# Patient Record
Sex: Female | Born: 1980 | Race: Black or African American | Hispanic: No | Marital: Single | Smoking: Never smoker
Health system: Southern US, Community
[De-identification: ages and names within clinical notes are randomized; demographics above are authoritative.]

## PROBLEM LIST (undated history)

## (undated) DIAGNOSIS — D573 Sickle-cell trait: Secondary | ICD-10-CM

## (undated) DIAGNOSIS — R519 Headache, unspecified: Secondary | ICD-10-CM

## (undated) DIAGNOSIS — D649 Anemia, unspecified: Secondary | ICD-10-CM

## (undated) HISTORY — PX: TOOTH EXTRACTION: SUR596

## (undated) HISTORY — DX: Headache, unspecified: R51.9

## (undated) HISTORY — DX: Anemia, unspecified: D64.9

---

## 2011-12-31 ENCOUNTER — Other Ambulatory Visit: Payer: Self-pay

## 2011-12-31 LAB — OB RESULTS CONSOLE RUBELLA ANTIBODY, IGM: Rubella: IMMUNE

## 2011-12-31 LAB — OB RESULTS CONSOLE GC/CHLAMYDIA
Chlamydia: NEGATIVE
Gonorrhea: NEGATIVE

## 2011-12-31 LAB — OB RESULTS CONSOLE HIV ANTIBODY (ROUTINE TESTING)
HIV: NONREACTIVE
HIV: NONREACTIVE

## 2011-12-31 LAB — OB RESULTS CONSOLE ABO/RH

## 2012-01-08 ENCOUNTER — Other Ambulatory Visit (HOSPITAL_COMMUNITY): Payer: Self-pay | Admitting: Obstetrics and Gynecology

## 2012-01-08 DIAGNOSIS — O2690 Pregnancy related conditions, unspecified, unspecified trimester: Secondary | ICD-10-CM

## 2012-01-08 DIAGNOSIS — Z3689 Encounter for other specified antenatal screening: Secondary | ICD-10-CM

## 2012-01-28 ENCOUNTER — Other Ambulatory Visit (HOSPITAL_COMMUNITY): Payer: Self-pay

## 2012-01-28 ENCOUNTER — Encounter (HOSPITAL_COMMUNITY): Payer: Self-pay | Admitting: Obstetrics and Gynecology

## 2012-02-05 ENCOUNTER — Ambulatory Visit (HOSPITAL_COMMUNITY)
Admission: RE | Admit: 2012-02-05 | Discharge: 2012-02-05 | Disposition: A | Discharge status: Home / Self Care | Payer: Medicaid Other | Source: Ambulatory Visit | Attending: Obstetrics and Gynecology | Admitting: Obstetrics and Gynecology

## 2012-02-05 ENCOUNTER — Encounter (HOSPITAL_COMMUNITY): Payer: Self-pay

## 2012-02-05 VITALS — BP 105/67 | HR 100 | Wt 216.0 lb

## 2012-02-05 DIAGNOSIS — O2690 Pregnancy related conditions, unspecified, unspecified trimester: Secondary | ICD-10-CM

## 2012-02-05 DIAGNOSIS — O358XX0 Maternal care for other (suspected) fetal abnormality and damage, not applicable or unspecified: Secondary | ICD-10-CM | POA: Insufficient documentation

## 2012-02-05 DIAGNOSIS — Z3689 Encounter for other specified antenatal screening: Secondary | ICD-10-CM | POA: Insufficient documentation

## 2012-02-05 NOTE — Progress Notes (Signed)
Obstetric ultrasound performed today.   Early ultrasound reports indicate the presence of bilateral small nuchal fluid collections suggestive of a cystic hygroma.  First trimester screening for fetal aneuploidy was completed with no increased risks for fetal aneuploidy reported.  Nuchal translucency measurements for this calculation were recorded as 1.66mm.  Patient declined further testing for fetal aneuploidy today.  The option of fetal echo was described and she elected to proceed with this.   Ultrasound findings were discussed and the patient received genetic counseling.    Fetal measurements consistent with dating by early ultrasound Normal amniotic fluid volume Normal fetal anatomic survey (some limited cardiac and face views) No markers of fetal aneuploidy identified  Repeat utlrasound scheduled in 6 weeks to re evaluate fetal anatomy.  Fetal echo scheduled.   Please see full report in [facility].

## 2012-02-05 NOTE — Progress Notes (Signed)
Genetic Counseling  High-Risk Gestation Note  Appointment Date:  02/05/2012 Referred By: Sherron Monday, MD Date of Birth:  12-10-1980  Pregnancy History: G2P1001 Estimated Date of Delivery: 07/01/12 Estimated Gestational Age: [redacted]w[redacted]d  Ms. Lajean Saver and her partner were seen for genetic counseling because of the finding of a possible cystic hygroma by outside scan.  Ultrasound performed at [redacted]w[redacted]d gestation revealed a nuchal translucency (NT) of 1.9 mm.  Bilateral cystic regions in the fetal neck were noted.  We discussed that the NT size is within the normal limit for the gestational age.  However, the finding of a cystic hygroma is associated with an increased risk for fetal aneuploidy.  The NT value of 1.9 was used for first trimester screening, which revealed a negative result (1 in 10,000 for fetal Down syndrome and 1 in 6600 for fetal trisomy 18).    They were counseled that a cystic hygroma describes a septated fluid filled sac at the back of the neck that typically results from failure of the fetal lymphatic system.  We discussed the various etiologies for a hygroma including normal variation (immature lymphatic system), a chromosome condition, single gene condition, or a congenital anomaly (heart defect). We discussed that if a true cystic hygroma was observed, the risk for aneuploidy could be as high as 50%.  Given the normal protein values found by maternal serum first trimester screening, and the small size of the reported NT, the risk is most likely much less. We reviewed chromosomes, nondisjunction, and the common features and variable prognoses of fetal aneuploidy including Down syndrome, trisomies 40 and 56, and Turner syndrome.    We reviewed available screening and diagnostic options.  Regarding screening tests, we discussed the options of ultrasound and noninvasive prenatal diagnosis (NIPT), also known as cell free fetal DNA testing.  In addition, we discussed the availability of  amniocentesis.  The risks, benefits, and limitations of each of these options were reviewed in detail.  After thoughtful consideration of these options, Ms. Collister elected to proceed with a detailed ultrasound.  The ultrasound report will be sent under separate cover.  Ultrasound today did not reveal any anomalies or soft markers for fetal aneuploidy.  There was no evidence of a cystic hygroma or nuchal thickening.  Ms. Josephson declined the option of NIPT and amniocentesis.  Because of the earlier suspicion of a cystic hygroma, the option of fetal echocardiogram was discussed.  She expressed interest in this testing.  We will contact her with an appointment for a fetal echocardiogram.  Ms. Vent was provided with written information regarding sickle cell anemia (SCA) including the carrier frequency and incidence in the African-American population, the availability of carrier testing and prenatal diagnosis if indicated.  In addition, we discussed that hemoglobinopathies are routinely screened for as part of the [place] newborn screening panel.  She declined hemoglobin electrophoresis today.  I counseled this couple regarding the above risks and available options.  The approximate face-to-face time with the genetic counselor was 28 minutes.  Despina Arias, MS Certified Genetic Counselor

## 2012-03-18 ENCOUNTER — Ambulatory Visit (HOSPITAL_COMMUNITY)
Admission: RE | Admit: 2012-03-18 | Discharge: 2012-03-18 | Disposition: A | Discharge status: Home / Self Care | Payer: Medicaid Other | Source: Ambulatory Visit | Attending: Obstetrics and Gynecology | Admitting: Obstetrics and Gynecology

## 2012-03-18 DIAGNOSIS — Z3689 Encounter for other specified antenatal screening: Secondary | ICD-10-CM

## 2012-03-18 DIAGNOSIS — Z1389 Encounter for screening for other disorder: Secondary | ICD-10-CM | POA: Insufficient documentation

## 2012-03-18 DIAGNOSIS — E669 Obesity, unspecified: Secondary | ICD-10-CM | POA: Insufficient documentation

## 2012-03-18 DIAGNOSIS — O358XX0 Maternal care for other (suspected) fetal abnormality and damage, not applicable or unspecified: Secondary | ICD-10-CM | POA: Insufficient documentation

## 2012-03-18 DIAGNOSIS — O2690 Pregnancy related conditions, unspecified, unspecified trimester: Secondary | ICD-10-CM

## 2012-03-18 DIAGNOSIS — Z363 Encounter for antenatal screening for malformations: Secondary | ICD-10-CM | POA: Insufficient documentation

## 2012-03-18 NOTE — Progress Notes (Signed)
Patient seen today  for follow up ultrasound.  See full report in [facility].  Alpha Gula, MD  Early ultrasound reports indicate the presence of bilateral small nuchal fluid collections suggestive of a cystic hygroma.  First trimester screening for fetal aneuploidy was completed with no increased risks for fetal aneuploidy reported.  Nuchal translucency measurements for this calculation were recorded as 1.66mm.  Patient had a normal fetal echo.  Single IUP at 25 0/7 weeks Interval fetal growth is appropriate Normal fetal anatomic survey s/p normal fetal echo Normal amniotic fluid volume  Recommend follow up as clinically indicated

## 2012-06-23 ENCOUNTER — Inpatient Hospital Stay (HOSPITAL_COMMUNITY)
Admission: AD | Admit: 2012-06-23 | Discharge: 2012-06-23 | Disposition: A | Discharge status: Home / Self Care | Payer: Medicaid Other | Source: Ambulatory Visit | Attending: Obstetrics and Gynecology | Admitting: Obstetrics and Gynecology

## 2012-06-23 ENCOUNTER — Encounter (HOSPITAL_COMMUNITY): Payer: Self-pay | Admitting: *Deleted

## 2012-06-23 DIAGNOSIS — O479 False labor, unspecified: Secondary | ICD-10-CM | POA: Insufficient documentation

## 2012-06-23 HISTORY — DX: Sickle-cell trait: D57.3

## 2012-06-23 MED ORDER — BUTORPHANOL TARTRATE 1 MG/ML IJ SOLN
1.0000 mg | Freq: Once | INTRAMUSCULAR | Status: AC
  Administered 2012-06-23: 1 mg via INTRAMUSCULAR
  Filled 2012-06-23: qty 1

## 2012-06-23 NOTE — MAU Note (Signed)
Patient states she is having contractions every 3-5 minutes with no leaking or bleeding. Reports good fetal movement.

## 2012-06-23 NOTE — MAU Note (Signed)
uc's since yesterday, more regular during the night.  Denies bleeding or LOF.

## 2012-06-24 ENCOUNTER — Encounter (HOSPITAL_COMMUNITY): Payer: Self-pay | Admitting: Anesthesiology

## 2012-06-24 ENCOUNTER — Inpatient Hospital Stay (HOSPITAL_COMMUNITY): Payer: Medicaid Other | Admitting: Anesthesiology

## 2012-06-24 ENCOUNTER — Encounter (HOSPITAL_COMMUNITY): Payer: Self-pay | Admitting: *Deleted

## 2012-06-24 ENCOUNTER — Inpatient Hospital Stay (HOSPITAL_COMMUNITY)
Admission: AD | Admit: 2012-06-24 | Discharge: 2012-06-26 | DRG: 775 | Disposition: A | Discharge status: Home / Self Care | Payer: Medicaid Other | Source: Ambulatory Visit | Attending: Obstetrics and Gynecology | Admitting: Obstetrics and Gynecology

## 2012-06-24 LAB — CBC
HCT: 29.2 % — ABNORMAL LOW (ref 36.0–46.0)
Hemoglobin: 10 g/dL — ABNORMAL LOW (ref 12.0–15.0)
MCV: 67.3 fL — ABNORMAL LOW (ref 78.0–100.0)
RBC: 4.34 MIL/uL (ref 3.87–5.11)
WBC: 10.8 10*3/uL — ABNORMAL HIGH (ref 4.0–10.5)

## 2012-06-24 LAB — ABO/RH: ABO/RH(D): O POS

## 2012-06-24 MED ORDER — DIBUCAINE 1 % RE OINT: 1.0000 "application " | TOPICAL_OINTMENT | RECTAL | Status: DC | PRN

## 2012-06-24 MED ORDER — EPHEDRINE 5 MG/ML INJ
10.0000 mg | INTRAVENOUS | Status: DC | PRN
  Filled 2012-06-24: qty 4

## 2012-06-24 MED ORDER — BENZOCAINE-MENTHOL 20-0.5 % EX AERO
1.0000 "application " | INHALATION_SPRAY | CUTANEOUS | Status: DC | PRN
  Administered 2012-06-24: 1 via TOPICAL
  Filled 2012-06-24: qty 56

## 2012-06-24 MED ORDER — FLEET ENEMA 7-19 GM/118ML RE ENEM: 1.0000 | ENEMA | RECTAL | Status: DC | PRN

## 2012-06-24 MED ORDER — DIPHENHYDRAMINE HCL 25 MG PO CAPS: 25.0000 mg | ORAL_CAPSULE | Freq: Four times a day (QID) | ORAL | Status: DC | PRN

## 2012-06-24 MED ORDER — OXYTOCIN 40 UNITS IN LACTATED RINGERS INFUSION - SIMPLE MED
1.0000 m[IU]/min | INTRAVENOUS | Status: DC
  Administered 2012-06-24: 2 m[IU]/min via INTRAVENOUS

## 2012-06-24 MED ORDER — FENTANYL 2.5 MCG/ML BUPIVACAINE 1/10 % EPIDURAL INFUSION (WH - ANES)
14.0000 mL/h | INTRAMUSCULAR | Status: DC
  Administered 2012-06-24: 14 mL/h via EPIDURAL
  Filled 2012-06-24 (×2): qty 60

## 2012-06-24 MED ORDER — PRENATAL MULTIVITAMIN CH
1.0000 | ORAL_TABLET | Freq: Every day | ORAL | Status: DC
  Administered 2012-06-24 – 2012-06-26 (×3): 1 via ORAL
  Filled 2012-06-24 (×3): qty 1

## 2012-06-24 MED ORDER — LACTATED RINGERS IV SOLN: 500.0000 mL | Freq: Once | INTRAVENOUS | Status: DC

## 2012-06-24 MED ORDER — LACTATED RINGERS IV SOLN: 500.0000 mL | INTRAVENOUS | Status: DC | PRN

## 2012-06-24 MED ORDER — FENTANYL 2.5 MCG/ML BUPIVACAINE 1/10 % EPIDURAL INFUSION (WH - ANES)
INTRAMUSCULAR | Status: DC | PRN
  Administered 2012-06-24: 14 mL/h via EPIDURAL

## 2012-06-24 MED ORDER — ONDANSETRON HCL 4 MG/2ML IJ SOLN: 4.0000 mg | Freq: Four times a day (QID) | INTRAMUSCULAR | Status: DC | PRN

## 2012-06-24 MED ORDER — TERBUTALINE SULFATE 1 MG/ML IJ SOLN: 0.2500 mg | Freq: Once | INTRAMUSCULAR | Status: DC | PRN

## 2012-06-24 MED ORDER — TETANUS-DIPHTH-ACELL PERTUSSIS 5-2.5-18.5 LF-MCG/0.5 IM SUSP: 0.5000 mL | Freq: Once | INTRAMUSCULAR | Status: DC

## 2012-06-24 MED ORDER — LIDOCAINE HCL (PF) 1 % IJ SOLN
30.0000 mL | INTRAMUSCULAR | Status: DC | PRN
  Filled 2012-06-24: qty 30

## 2012-06-24 MED ORDER — SENNOSIDES-DOCUSATE SODIUM 8.6-50 MG PO TABS
2.0000 | ORAL_TABLET | Freq: Every day | ORAL | Status: DC
  Administered 2012-06-24 – 2012-06-25 (×2): 2 via ORAL

## 2012-06-24 MED ORDER — DIPHENHYDRAMINE HCL 50 MG/ML IJ SOLN: 12.5000 mg | INTRAMUSCULAR | Status: DC | PRN

## 2012-06-24 MED ORDER — OXYTOCIN 40 UNITS IN LACTATED RINGERS INFUSION - SIMPLE MED
62.5000 mL/h | Freq: Once | INTRAVENOUS | Status: DC
  Filled 2012-06-24: qty 1000

## 2012-06-24 MED ORDER — IBUPROFEN 600 MG PO TABS: 600.0000 mg | ORAL_TABLET | Freq: Four times a day (QID) | ORAL | Status: DC | PRN

## 2012-06-24 MED ORDER — OXYTOCIN 40 UNITS IN LACTATED RINGERS INFUSION - SIMPLE MED: 62.5000 mL/h | INTRAVENOUS | Status: AC | PRN

## 2012-06-24 MED ORDER — SIMETHICONE 80 MG PO CHEW: 80.0000 mg | CHEWABLE_TABLET | ORAL | Status: DC | PRN

## 2012-06-24 MED ORDER — LIDOCAINE HCL (PF) 1 % IJ SOLN
INTRAMUSCULAR | Status: DC | PRN
  Administered 2012-06-24: 30 mL
  Administered 2012-06-24 (×2): 4 mL

## 2012-06-24 MED ORDER — ONDANSETRON HCL 4 MG/2ML IJ SOLN: 4.0000 mg | INTRAMUSCULAR | Status: DC | PRN

## 2012-06-24 MED ORDER — CITRIC ACID-SODIUM CITRATE 334-500 MG/5ML PO SOLN: 30.0000 mL | ORAL | Status: DC | PRN

## 2012-06-24 MED ORDER — IBUPROFEN 600 MG PO TABS
600.0000 mg | ORAL_TABLET | Freq: Four times a day (QID) | ORAL | Status: DC
  Administered 2012-06-24 – 2012-06-26 (×8): 600 mg via ORAL
  Filled 2012-06-24 (×10): qty 1

## 2012-06-24 MED ORDER — PHENYLEPHRINE 40 MCG/ML (10ML) SYRINGE FOR IV PUSH (FOR BLOOD PRESSURE SUPPORT): 80.0000 ug | PREFILLED_SYRINGE | INTRAVENOUS | Status: DC | PRN

## 2012-06-24 MED ORDER — LACTATED RINGERS IV SOLN: INTRAVENOUS | Status: AC

## 2012-06-24 MED ORDER — OXYCODONE-ACETAMINOPHEN 5-325 MG PO TABS
1.0000 | ORAL_TABLET | ORAL | Status: DC | PRN
  Filled 2012-06-24: qty 1

## 2012-06-24 MED ORDER — ACETAMINOPHEN 325 MG PO TABS: 650.0000 mg | ORAL_TABLET | ORAL | Status: DC | PRN

## 2012-06-24 MED ORDER — MEASLES, MUMPS & RUBELLA VAC ~~LOC~~ INJ
0.5000 mL | INJECTION | Freq: Once | SUBCUTANEOUS | Status: DC
  Filled 2012-06-24: qty 0.5

## 2012-06-24 MED ORDER — OXYCODONE-ACETAMINOPHEN 5-325 MG PO TABS: 1.0000 | ORAL_TABLET | ORAL | Status: DC | PRN

## 2012-06-24 MED ORDER — BUTORPHANOL TARTRATE 1 MG/ML IJ SOLN
1.0000 mg | Freq: Once | INTRAMUSCULAR | Status: AC
  Administered 2012-06-24: 1 mg via INTRAVENOUS
  Filled 2012-06-24: qty 1

## 2012-06-24 MED ORDER — PHENYLEPHRINE 40 MCG/ML (10ML) SYRINGE FOR IV PUSH (FOR BLOOD PRESSURE SUPPORT)
80.0000 ug | PREFILLED_SYRINGE | INTRAVENOUS | Status: DC | PRN
  Filled 2012-06-24: qty 5

## 2012-06-24 MED ORDER — WITCH HAZEL-GLYCERIN EX PADS: 1.0000 "application " | MEDICATED_PAD | CUTANEOUS | Status: DC | PRN

## 2012-06-24 MED ORDER — ONDANSETRON HCL 4 MG PO TABS: 4.0000 mg | ORAL_TABLET | ORAL | Status: DC | PRN

## 2012-06-24 MED ORDER — OXYTOCIN BOLUS FROM INFUSION
250.0000 mL | Freq: Once | INTRAVENOUS | Status: AC
  Administered 2012-06-24: 250 mL via INTRAVENOUS
  Filled 2012-06-24: qty 500

## 2012-06-24 MED ORDER — EPHEDRINE 5 MG/ML INJ: 10.0000 mg | INTRAVENOUS | Status: DC | PRN

## 2012-06-24 MED ORDER — ZOLPIDEM TARTRATE 5 MG PO TABS: 5.0000 mg | ORAL_TABLET | Freq: Every evening | ORAL | Status: DC | PRN

## 2012-06-24 MED ORDER — LANOLIN HYDROUS EX OINT: TOPICAL_OINTMENT | CUTANEOUS | Status: DC | PRN

## 2012-06-24 MED ORDER — LACTATED RINGERS IV SOLN
INTRAVENOUS | Status: DC
  Administered 2012-06-24 (×3): via INTRAVENOUS

## 2012-06-24 NOTE — Progress Notes (Signed)
Patient ID: Stacy Weaver, female   DOB: 28-Jul-1981, 28 y.o.   MRN: [id number] Exam at 8:50 AM cervix 7-8 cm 100% effaced and the vertex was at 0 station

## 2012-06-24 NOTE — Anesthesia Postprocedure Evaluation (Signed)
  Anesthesia Post-op Note  Patient: Stacy Weaver  Procedure(s) Performed: * No procedures listed *  Patient Location: Mother/Baby  Anesthesia Type: Epidural  Level of Consciousness: awake, alert  and oriented  Airway and Oxygen Therapy: Patient Spontanous Breathing  Post-op Pain: mild  Post-op Assessment: Post-op Vital signs reviewed, Patient's Cardiovascular Status Stable, Pain level controlled, No headache, No backache, No residual numbness and No residual motor weakness  Post-op Vital Signs: Reviewed and stable  Complications: No apparent anesthesia complications

## 2012-06-24 NOTE — Anesthesia Preprocedure Evaluation (Signed)

## 2012-06-24 NOTE — Progress Notes (Signed)
Patient ID: Stacy Weaver, female   DOB: 24-Sep-1981, 31 y.o.   MRN: [id number] Delivery note:  The pt became fully dilated and with 3 or 4 contractions delivered a living female infant spontaneously LOA over a first degree laceration. Apgars were 9 and 9 at 1 and 5 minutes. There was no explanation for the FHR decelerations. The placenta was intact and the uterus was normal The laceration was repaired with 3-0 vicryl under local block at the pt's request. EBL 300 cc's.

## 2012-06-24 NOTE — Progress Notes (Signed)
Patient ID: Stacy Weaver, female   DOB: 08-11-1981, 32 y.o.   MRN: [id number] At 7:55 AM cervix was 4-5 cm 70% effaced and the vertex was at - 2 station. AROM produced thin, lightly meconium stained fluid and the FHR decelerated into the 70's or 80's. The pt was positioned and the FHR recovered with decelerations only with contractions. O2 was begun Will watch closely/

## 2012-06-24 NOTE — MAU Note (Signed)
Contractions, denies bleeding or ROM 

## 2012-06-24 NOTE — Anesthesia Procedure Notes (Signed)
Epidural Patient location during procedure: OB Start time: 06/24/2012 4:55 AM  Staffing Anesthesiologist: Myrl Bynum A. Performed by: anesthesiologist   Preanesthetic Checklist Completed: patient identified, site marked, surgical consent, pre-op evaluation, timeout performed, IV checked, risks and benefits discussed and monitors and equipment checked  Epidural Patient position: sitting Prep: site prepped and draped and DuraPrep Patient monitoring: continuous pulse ox and blood pressure Approach: midline Injection technique: LOR air  Needle:  Needle type: Tuohy  Needle gauge: 17 G Needle length: 9 cm Needle insertion depth: 8 cm Catheter type: closed end flexible Catheter size: 19 Gauge Catheter at skin depth: 13 cm Test dose: negative and Other  Assessment Events: blood not aspirated, injection not painful, no injection resistance, negative IV test and no paresthesia  Additional Notes Patient identified. Risks and benefits discussed including failed block, incomplete  Pain control, post dural puncture headache, nerve damage, paralysis, blood pressure Changes, nausea, vomiting, reactions to medications-both toxic and allergic and post Partum back pain. All questions were answered. Patient expressed understanding and wished to proceed. Sterile technique was used throughout procedure. Epidural site was Dressed with sterile barrier dressing. No paresthesias, signs of intravascular injection Or signs of intrathecal spread were encountered.  Patient was more comfortable after the epidural was dosed. Please see RN's note for documentation of vital signs and FHR which are stable.

## 2012-06-24 NOTE — H&P (Signed)
Stacy Weaver is a 31 y.o. female G2 P1001 at 39 weeks (EDD 07/01/12 by 13 week [place]) presenting for persistent contractions for over 24 hours. Was seen in MAU 8/26 with contractions and observed for 3 hours with minimal cervical change.  Pt d/c home and returned early this AM at about 2am.  Cervix was still about the same, but as patient was very uncomfortable was given pain meds and observed for 2 hours and cervix eventually did change to 4 cm so was admitted.  Prenatal care was comp[licated by a finding of possible bilateral small cystic hygromas at 13 weeks.  On consult with MFM they were resolved and first trimester screen WNL.  THe patient declined amnio, but consented to an anatomy US and fetal [facility] which were both WNL.  Her prenatal care started a bit late at 13-14 weeks, but was consistent after that.  Maternal Medical History:  Reason for admission: Reason for admission: contractions.  Contractions: Onset was 13-24 hours ago.   Frequency: regular.   Perceived severity is moderate.    Fetal activity: Perceived fetal activity is normal.      OB History    Grav Para Term Preterm Abortions TAB SAB Ect Mult Living   2 1 1  0 0 0 0 0 0 1    NSVD 2011 7#4oz  Past Medical History  Diagnosis Date  . Sickle cell trait   (Hgb AC)  Past Surgical History  Procedure Date  . Tooth extraction    Family History: family history is not on file. Social History:  reports that she has never smoked. She does not have any smokeless tobacco history on file. She reports that she does not drink alcohol or use illicit drugs.   Prenatal Transfer Tool  Maternal Diabetes: No Genetic Screening: Normal First trimester screen and AFP Maternal Ultrasounds/Referrals: Abnormal:  Findings:   Other: possible early bilateral cystic hygromas that resolved Fetal Ultrasounds or other Referrals:  Fetal echo, Referred to Materal Fetal Medicine  further w/u WNL Maternal Substance Abuse:  No Significant Maternal  Medications:  None Significant Maternal Lab Results:  Lab values include: Other:   Hgb AC carrier Other Comments:  None  ROS  Dilation: 5 Effacement (%): 60 Station: -3 Exam by:: a. white rn Blood pressure 107/70, pulse 78, temperature 97.7 F (36.5 C), temperature source Axillary, resp. rate 18, height 5\' 6"  (1.676 m), weight 97.977 kg (216 lb), last menstrual period 10/04/2011, SpO2 100.00%. Maternal Exam:  Uterine Assessment: Contraction strength is moderate.  Contraction frequency is regular.   Abdomen: Patient reports no abdominal tenderness. Fetal presentation: vertex  Introitus: Normal vulva. Normal vagina.    Physical Exam  Constitutional: She is oriented to person, place, and time. She appears well-developed and well-nourished.  Cardiovascular: Normal rate and regular rhythm.   Respiratory: Effort normal and breath sounds normal.  GI: Soft. Bowel sounds are normal.  Genitourinary: Vagina normal and uterus normal.  Neurological: She is alert and oriented to person, place, and time.  Psychiatric: She has a normal mood and affect. Her behavior is normal.    Prenatal labs: ABO, Rh: O/Positive/-- (03/05 0000) Antibody: Negative (03/05 0000) Rubella: Immune (03/05 0000) RPR: Nonreactive (03/05 0000)  HBsAg: Negative (03/05 0000)  HIV: Non-reactive, Non-reactive (03/05 0000)  GBS: Negative (08/07 0000)  One hour GCT 105 Hgb AC First trimester screen WNL  Assessment/Plan: Pt admitted with cervical change.  Received epidural and comfortable.  Augmented with pitocin.  Will attempt AROM when station a  little lower.   Oliver Pila 06/24/2012, 6:29 AM

## 2012-06-25 LAB — CBC
MCH: 23.2 pg — ABNORMAL LOW (ref 26.0–34.0)
MCHC: 34.4 g/dL (ref 30.0–36.0)
MCV: 67.6 fL — ABNORMAL LOW (ref 78.0–100.0)
Platelets: 276 10*3/uL (ref 150–400)
RBC: 3.7 MIL/uL — ABNORMAL LOW (ref 3.87–5.11)

## 2012-06-25 NOTE — Progress Notes (Signed)
Patient ID: Stacy Weaver, female   DOB: Feb 26, 1981, 31 y.o.   MRN: [id number] #1 afebrile BP normal HGB 10.0 to 8.5 Some cramps

## 2012-06-25 NOTE — Progress Notes (Signed)
UR chart review completed.  

## 2012-06-26 ENCOUNTER — Ambulatory Visit (HOSPITAL_COMMUNITY): Payer: Medicaid Other

## 2012-06-26 MED ORDER — IBUPROFEN 600 MG PO TABS: 600.0000 mg | ORAL_TABLET | Freq: Four times a day (QID) | ORAL | Status: AC | PRN

## 2012-06-26 MED ORDER — FERROUS SULFATE 325 (65 FE) MG PO TABS: 325.0000 mg | ORAL_TABLET | Freq: Every day | ORAL | Status: DC

## 2012-06-26 NOTE — Progress Notes (Signed)
Patient ID: Stacy Weaver, female   DOB: 08-Dec-1980, 31 y.o.   MRN: [id number] #[id number] afebrile BP normal no complaints ready for d/c

## 2012-06-27 NOTE — Discharge Summary (Signed)
Stacy Weaver, Stacy Weaver NO.:  [id number]  MEDICAL RECORD NO.:  [id number]  LOCATION:  9110                          FACILITY:  [facility]  PHYSICIAN:  Malachi Pro. Ambrose Mantle, M.D. DATE OF BIRTH:  10-23-1981  DATE OF ADMISSION:  06/24/2012 DATE OF DISCHARGE:  06/26/2012                              DISCHARGE SUMMARY   HISTORY OF PRESENT ILLNESS:  This is a 31 year old black female, para 1- 0-0-1, gravida 2, EDC July 01, 2012, by 13-week ultrasound, presented for persistent contractions.  She was seen in the Maternity Admission Unit on June 22, 2012, with contractions and observed for 3 hours with minimal cervical change, discharged home, returned early in the morning on June 24, 2012.  Cervix still about the same.  She was very uncomfortable.  She was observed for 2 hours.  The cervix eventually did change to 4 cm, so she was admitted.  Prenatal care was complicated by a finding of possible bilateral small cystic hygromas at 13 weeks.  When she was seen in the MFM, they had resolved.  First trimester screen was normal.  The patient declined amnio.  Fetal echo was normal.  Anatomy ultrasound was normal.  Blood group and type O positive, negative antibody, rubella immune, RPR nonreactive, hepatitis B surface antigen negative, HIV negative, GBS negative.  One-hour Glucola 105, hemoglobin AC, first trimester screen was normal.  The patient has a negative medical history.  She has had tooth extracted, but otherwise no surgical history.  She never smoked.  She did not drink alcohol or take drugs.  At 7:55 a.m., the cervix was 4-5 cm, 70%, vertex at a -2.  Artificial rupture of the membranes produced thin, slightly meconium-stained fluid.  Fetal heart rate decelerated into the 70s or 80s.  The patient was positioned.  The fetal heart rate recovered with decelerations only with contractions.  Oxygen was begun.  Exam at 8:50 a.m., the cervix was 7-8 cm, 100% vertex at 0 station.   The patient became fully dilated, and with 3 or 4 contractions, delivered a living female infant, spontaneously LOA over first-degree laceration.  Apgars were 9 and 9 at 1 and 5 minutes.  There was no explanation for the fetal heart rate decelerations.  The placenta was intact.  Uterus normal. Laceration repaired with 3-0 Vicryl under local block at the patient's request.  Blood loss about 300 mL.  Postpartum, the patient did well and was discharged on the second postpartum day.  Initial hemoglobin 10.0, hematocrit 29.2, white count 10,800, platelet count 342,000, RPR nonreactive.  Followup hemoglobin 8.6, hematocrit 25.0.  FINAL DIAGNOSES:  Intrauterine pregnancy at 39 weeks, delivered, vertex.  OPERATIONS: 1. Spontaneous delivery, vertex. 2. Repair of first-degree perineal laceration.  FINAL CONDITION:  Improved.  DISCHARGE INSTRUCTIONS:  Include our regular discharge instruction booklet.  The patient was advised to return in 6 weeks.  She was given a discharge instruction booklet and the after visit summary.  MEDICATIONS:  Motrin 600 mg, 30 tablets, 1 every 6 hours as needed for pain was given at discharge.     Malachi Pro. Ambrose Mantle, M.D.     TFH/MEDQ  D:  06/26/2012  T:  06/27/2012  Job:  [id number]

## 2012-07-01 ENCOUNTER — Ambulatory Visit (HOSPITAL_COMMUNITY): Payer: Medicaid Other

## 2012-08-25 ENCOUNTER — Encounter (HOSPITAL_COMMUNITY): Payer: Self-pay | Admitting: Emergency Medicine

## 2012-08-25 ENCOUNTER — Emergency Department (HOSPITAL_COMMUNITY)
Admission: EM | Admit: 2012-08-25 | Discharge: 2012-08-25 | Disposition: A | Discharge status: Home / Self Care | Payer: Medicaid Other | Source: Home / Self Care | Attending: Emergency Medicine | Admitting: Emergency Medicine

## 2012-08-25 DIAGNOSIS — G609 Hereditary and idiopathic neuropathy, unspecified: Secondary | ICD-10-CM

## 2012-08-25 DIAGNOSIS — G629 Polyneuropathy, unspecified: Secondary | ICD-10-CM

## 2012-08-25 MED ORDER — PREDNISONE 10 MG PO TABS: ORAL_TABLET | ORAL | Status: DC

## 2012-08-25 NOTE — ED Notes (Signed)
Reports migraine on Sunday: starts out as flashing lights, then goes to one side of head .  This time, pain was on right side of head.    Migraine left yesterday.  Then started having numbness and tingling in left arm and hand.  Patient thinks numbness and tingling has been intermittent, but when it occurs it is getting worse.  Reports no change in strength per patient.  Patient is left handed.  Denies neck pain.  Patient works with computers-billing at [employer]

## 2012-08-25 NOTE — ED Notes (Addendum)
Patient took benadryl because she broke out with hives.   Patient reports she had ibuprofen, but has never reacted to ibuprofen before.

## 2012-08-25 NOTE — ED Provider Notes (Signed)
Chief Complaint  Patient presents with  . Numbness    History of Present Illness:   The patient is a 31 year old female who presents with a two-day history of numbness and tingling of the left arm and hand. This came on after she was holding her infant son in her arm for several hours at a time. After she got up, she felt that the arm had fallen asleep and it hasn't completely gotten back to normal yet. The arm feels somewhat achy and stiff. She denies any weakness of the arm. The patient had a migraine headache this past Sunday, 3 days ago. It was located on the right side with visual aura, photophobia, phonophobia, and nausea. She took ibuprofen and slept for a few hours and the headache went away. It has not come back since that. After taking ibuprofen she broke out in hives. She took a Benadryl and is now better. The hives are mostly on her face. She has gotten hives before after eating seafood but never after ibuprofen. She denies any difficulty breathing or wheezing, swelling of lips, tongue, or throat. She's had no diplopia or blurry vision but does have pain behind her left eye. She denies any difficulty speaking or swallowing. There is no neck pain and she has full range of motion of her neck and shoulder. She denies any numbness in the face. There is no numbness, tingling, or weakness in the lower extremities.  Review of Systems:  Other than noted above, the patient denies any of the following symptoms: Systemic:  No fever, chills, fatigue, photophobia, stiff neck. Eye:  No redness, eye pain, discharge, blurred vision, or diplopia. ENT:  No nasal congestion, rhinorrhea, sinus pressure or pain, sneezing, earache, or sore throat.  No jaw claudication. Neuro:  No paresthesias, loss of consciousness, seizure activity, muscle weakness, trouble with coordination or gait, trouble speaking or swallowing. Psych:  No depression, anxiety or trouble sleeping.  PMFSH:  Past medical history, family  history, social history, meds, and allergies were reviewed.  Physical Exam:   Vital signs:  BP 117/77  Pulse 89  Temp 97.6 F (36.4 C) (Oral)  Resp 16  SpO2 97%  Breastfeeding? Unknown General:  Alert and oriented.  In no distress. Eye:  Lids and conjunctivas normal.  PERRL,  Full EOMs.  Fundi benign with normal discs and vessels. ENT:  No cranial or facial tenderness to palpation.  TMs and canals clear.  Nasal mucosa was normal and uncongested without any drainage. No intra oral lesions, pharynx clear, mucous membranes moist, dentition normal. Neck:  Supple, full ROM, no tenderness to palpation.  No adenopathy or mass. No carotid bruit. Lungs: Clear to auscultation. Heart: Regular rhythm, no gallop or murmur. Extremities: No swelling, pulses full, joint survey is unremarkable, muscle strength and sensation were normal, Tinel and Phalen signs were negative. Neuro:  Alert and orented times 3.  Speech was clear, fluent, and appropriate.  Cranial nerves intact. No pronator drift, muscle strength normal. Finger to nose normal.  DTRs were 2+ and symmetrical.Station and gait were normal.  Romberg's sign was normal.  Able to perform tandem gait well. Psych:  Normal affect.  Course in Urgent Care Center:   She was given a wrist splint.  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 It may be in the cubital tunnel area, carpal tunnel area, or even in the neck or shoulder. If it  persists despite conservative management, she will need to see a neurologist.  Plan:   1.  The following meds were prescribed:   New Prescriptions   PREDNISONE (DELTASONE) 10 MG TABLET    2 tabs daily for 10 days, then 1 tab daily for 10 days.   2.  The patient was instructed in symptomatic care and handouts were given. She was given some exercises to do for the arm. 3.  The patient was told to return if becoming  worse in any way, if no better in 3 or 4 days, and given some red flag symptoms that would indicate earlier return.    Reuben Likes, MD 08/25/12 [id number]

## 2013-08-26 ENCOUNTER — Encounter (HOSPITAL_COMMUNITY): Payer: Self-pay | Admitting: Emergency Medicine

## 2013-08-26 ENCOUNTER — Emergency Department (INDEPENDENT_AMBULATORY_CARE_PROVIDER_SITE_OTHER)
Admission: EM | Admit: 2013-08-26 | Discharge: 2013-08-26 | Disposition: A | Discharge status: Home / Self Care | Payer: Self-pay | Source: Home / Self Care | Attending: Family Medicine | Admitting: Family Medicine

## 2013-08-26 DIAGNOSIS — H9209 Otalgia, unspecified ear: Secondary | ICD-10-CM

## 2013-08-26 DIAGNOSIS — J069 Acute upper respiratory infection, unspecified: Secondary | ICD-10-CM

## 2013-08-26 DIAGNOSIS — H9203 Otalgia, bilateral: Secondary | ICD-10-CM

## 2013-08-26 MED ORDER — AZITHROMYCIN 250 MG PO TABS: 250.0000 mg | ORAL_TABLET | Freq: Every day | ORAL | Status: DC

## 2013-08-26 MED ORDER — IPRATROPIUM BROMIDE 0.03 % NA SOLN: 2.0000 | Freq: Two times a day (BID) | NASAL | Status: DC

## 2013-08-26 NOTE — ED Notes (Signed)
Pt c/o poss sinus infection States she just got over a cold about 1 week ago but still having some sxs Sxs include: facial pressure, ears clogged, nauseas, congestion Denies: f/v/d, SOB, wheezing Taking sudafed w/temp relief.  Alert w/no signs of acute distress.

## 2013-08-26 NOTE — ED Provider Notes (Signed)
Stacy Weaver is a 32 y.o. female who presents to Urgent Care today for facial pressure nasal congestion and bilateral ear pain with mild nausea and mild dizziness. No fever sore throat or cough. Some sneezing. Patient has tried oral Sudafed which helped a bit. Symptoms have been present for about one week. Symptoms started after she got over a cold. She denies any chest pains palpitations vomiting or diarrhea. She feels well otherwise.   Past Medical History  Diagnosis Date  . Sickle cell trait    History  Substance Use Topics  . Smoking status: Never Smoker   . Smokeless tobacco: Not on file  . Alcohol Use: No   ROS as above Medications reviewed. No current facility-administered medications for this encounter.   Current Outpatient Prescriptions  Medication Sig Dispense Refill  . acetaminophen (TYLENOL) 325 MG tablet Take 650 mg by mouth every 6 (six) hours as needed.      Marland Kitchen azithromycin (ZITHROMAX) 250 MG tablet Take 1 tablet (250 mg total) by mouth daily. Take first 2 tablets together, then 1 every day until finished.  6 tablet  0  . diphenhydrAMINE (BENADRYL) 25 mg capsule Take 25 mg by mouth every 6 (six) hours as needed.      . ferrous sulfate 325 (65 FE) MG tablet Take 1 tablet (325 mg total) by mouth daily with breakfast.  30 tablet  11  . ibuprofen (ADVIL,MOTRIN) 200 MG tablet Take 200 mg by mouth every 6 (six) hours as needed.      Marland Kitchen ipratropium (ATROVENT) 0.03 % nasal spray Place 2 sprays into the nose every 12 (twelve) hours.  30 mL  1  . Prenatal Vit-Fe Fumarate-FA (PRENATAL MULTIVITAMIN) TABS Take 1 tablet by mouth every morning.        Exam:  BP 107/70  Pulse 85  Temp(Src) 98.3 F (36.8 C) (Oral)  Resp 16  SpO2 100%  LMP 08/23/2013 Gen: Well NAD HEENT: EOMI,  MMM, tympanic membranes bilaterally have mild effusion with retraction and no erythema. Posterior pharynx with some cobblestoning. Nontender maxillary sinuses bilaterally. Lungs: CTABL Nl WOB Heart: RRR no  MRG Abd: NABS, NT, ND Exts: Non edematous BL  LE, warm and well perfused.    Assessment and Plan: 32 y.o. female with nasal congestion with ear pain and mild effusion. Some evidence of eustachian tube dysfunction.  Plan to treat with Atrovent nasal spray, and oral decongestants. Additionally we'll use azithromycin if not improving in several days. Work note provided.  Discussed warning signs or symptoms. Please see discharge instructions. Patient expresses understanding.      Rodolph Bong, MD 08/26/13 1224

## 2013-09-08 ENCOUNTER — Emergency Department: Payer: Self-pay | Admitting: Emergency Medicine

## 2013-09-08 LAB — URINALYSIS, COMPLETE
Bacteria: NONE SEEN
Bilirubin,UR: NEGATIVE
Ketone: NEGATIVE
RBC,UR: <1 /HPF (ref 0–5)
Specific Gravity: 1.01 (ref 1.003–1.030)
Squamous Epithelial: 23
WBC UR: 16 /HPF (ref 0–5)

## 2013-09-11 ENCOUNTER — Emergency Department: Payer: Self-pay | Admitting: Emergency Medicine

## 2014-08-29 ENCOUNTER — Encounter (HOSPITAL_COMMUNITY): Payer: Self-pay | Admitting: Emergency Medicine

## 2021-03-18 ENCOUNTER — Encounter: Payer: Self-pay | Admitting: Emergency Medicine

## 2021-03-18 ENCOUNTER — Other Ambulatory Visit: Payer: Self-pay

## 2021-03-18 DIAGNOSIS — K120 Recurrent oral aphthae: Secondary | ICD-10-CM | POA: Insufficient documentation

## 2021-03-18 DIAGNOSIS — Z91040 Latex allergy status: Secondary | ICD-10-CM | POA: Insufficient documentation

## 2021-03-18 MED ORDER — ACETAMINOPHEN 500 MG PO TABS
1000.0000 mg | ORAL_TABLET | Freq: Once | ORAL | Status: DC
  Filled 2021-03-18: qty 2

## 2021-03-18 NOTE — ED Triage Notes (Signed)
Pt reports that she has had dental abscesses before, she has had right sided lower dental pain for the last three days. She has slight swelling on the right lower part of her jaw.

## 2021-03-18 NOTE — ED Notes (Signed)
Pt requesting tylenol 

## 2021-03-19 ENCOUNTER — Emergency Department
Admission: EM | Admit: 2021-03-19 | Discharge: 2021-03-19 | Disposition: A | Discharge status: Home / Self Care | Payer: Self-pay | Attending: Emergency Medicine | Admitting: Emergency Medicine

## 2021-03-19 DIAGNOSIS — K120 Recurrent oral aphthae: Secondary | ICD-10-CM

## 2021-03-19 NOTE — Discharge Instructions (Signed)
Apply baking soda over it several times a day to help with pain. You may also use oragel which you can buy at a pharmacy without prescription

## 2021-03-19 NOTE — ED Provider Notes (Signed)
[facility] Emergency Department Provider Note  ____________________________________________  Time seen: Approximately 12:13 AM  I have reviewed the triage vital signs and the nursing notes.   HISTORY  Chief Complaint Dental Pain   HPI Stacy Weaver is a 40 y.o. female who presents for evaluation of dental pain.  Patient has had the pain for 3 days.  She describes the pain as burning and throbbing.  She feels that the right side of her face is swelling.  She is concerned that he might be a dental abscess.  She has not seen a dentist.  She has been using mouthwash with no significant relief.   Past Medical History:  Diagnosis Date  . Sickle cell trait (HCC)     There are no problems to display for this patient.   Past Surgical History:  Procedure Laterality Date  . TOOTH EXTRACTION      Prior to Admission medications   Medication Sig Start Date End Date Taking? Authorizing Provider  acetaminophen (TYLENOL) 325 MG tablet Take 650 mg by mouth every 6 (six) hours as needed.    [provider]  azithromycin (ZITHROMAX) 250 MG tablet Take 1 tablet (250 mg total) by mouth daily. Take first 2 tablets together, then 1 every day until finished. 08/26/13   Rodolph Bong, MD  diphenhydrAMINE (BENADRYL) 25 mg capsule Take 25 mg by mouth every 6 (six) hours as needed.    [provider]  ferrous sulfate 325 (65 FE) MG tablet Take 1 tablet (325 mg total) by mouth daily with breakfast. 06/26/12 06/26/13  Tracey Harries, MD  ibuprofen (ADVIL,MOTRIN) 200 MG tablet Take 200 mg by mouth every 6 (six) hours as needed.    [provider]  ipratropium (ATROVENT) 0.03 % nasal spray Place 2 sprays into the nose every 12 (twelve) hours. 08/26/13   Rodolph Bong, MD  Prenatal Vit-Fe Fumarate-FA (PRENATAL MULTIVITAMIN) TABS Take 1 tablet by mouth every morning.    [provider]    Allergies Latex and Nsaids  No family history on  file.  Social History Social History   Tobacco Use  . Smoking status: Never Smoker  Substance Use Topics  . Alcohol use: No  . Drug use: No    Review of Systems  Constitutional: Negative for fever. Eyes: Negative for visual changes. ENT: Negative for sore throat. + gum pain Neck: No neck pain  Cardiovascular: Negative for chest pain. Respiratory: Negative for shortness of breath. Gastrointestinal: Negative for abdominal pain, vomiting or diarrhea. Genitourinary: Negative for dysuria. Musculoskeletal: Negative for back pain. Skin: Negative for rash. Neurological: Negative for headaches, weakness or numbness. Psych: No SI or HI  ____________________________________________   PHYSICAL EXAM:  VITAL SIGNS: ED Triage Vitals  Enc Vitals Group     BP 03/18/21 2209 128/87     Pulse Rate 03/18/21 2209 73     Resp 03/18/21 2209 20     Temp 03/18/21 2209 98.5 F (36.9 C)     Temp Source 03/18/21 2209 Oral     SpO2 03/18/21 2209 98 %     Weight 03/18/21 2210 215 lb (97.5 kg)     Height 03/18/21 2210 5\' 5"  (1.651 m)     Head Circumference --      Peak Flow --      Pain Score 03/18/21 2210 9     Pain Loc --      Pain Edu? --      Excl. in GC? --  Constitutional: Alert and oriented. Well appearing and in no apparent distress. HEENT:      Head: Normocephalic and atraumatic.         Eyes: Conjunctivae are normal. Sclera is non-icteric.       Mouth/Throat: Mucous membranes are moist.  Patient has an aphthous ulcer in the right lower inner lip, several cavities noted, no dental abscess, floor of the mouth is soft with no induration or erythema, no trismus      Neck: Supple with no signs of meningismus. Cardiovascular: Regular rate and rhythm.  Respiratory: Normal respiratory effort.  Musculoskeletal:  No edema, cyanosis, or erythema of extremities. Neurologic: Normal speech and language. Face is symmetric. Moving all extremities. No gross focal neurologic deficits are  appreciated. Skin: Skin is warm, dry and intact. No rash noted. Psychiatric: Mood and affect are normal. Speech and behavior are normal.  ____________________________________________   LABS (all labs ordered are listed, but only abnormal results are displayed)  Labs Reviewed - No data to display ____________________________________________  EKG  none  ____________________________________________  RADIOLOGY  none  ____________________________________________   PROCEDURES  Procedure(s) performed: None Procedures Critical Care performed:  None ____________________________________________   INITIAL IMPRESSION / ASSESSMENT AND PLAN / ED COURSE   40 y.o. female who presents for evaluation of dental pain.  Presentation consistent with an aphthous ulcer.  Recommended Orajel and baking soda to help with pain.  Discussed supportive care follow-up with PCP      _____________________________________________ Please note:  Patient was evaluated in Emergency Department today for the symptoms described in the history of present illness. Patient was evaluated in the context of the global COVID-19 pandemic, which necessitated consideration that the patient might be at risk for infection with the SARS-CoV-2 virus that causes COVID-19. Institutional protocols and algorithms that pertain to the evaluation of patients at risk for COVID-19 are in a state of rapid change based on information released by regulatory bodies including the CDC and federal and state organizations. These policies and algorithms were followed during the patient's care in the ED.  Some ED evaluations and interventions may be delayed as a result of limited staffing during the pandemic.   [place] Controlled Substance Database was reviewed by me. ____________________________________________   FINAL CLINICAL IMPRESSION(S) / ED DIAGNOSES   Final diagnoses:  Aphthous ulcer      NEW MEDICATIONS STARTED DURING THIS  VISIT:  ED Discharge Orders    None       Note:  This document was prepared using Dragon voice recognition software and may include unintentional dictation errors.    Don Perking, [place], MD 03/19/21 [phone]

## 2021-09-02 ENCOUNTER — Emergency Department: Payer: Medicaid Other

## 2021-09-02 ENCOUNTER — Other Ambulatory Visit: Payer: Self-pay

## 2021-09-02 DIAGNOSIS — Z20822 Contact with and (suspected) exposure to covid-19: Secondary | ICD-10-CM | POA: Insufficient documentation

## 2021-09-02 DIAGNOSIS — Z91040 Latex allergy status: Secondary | ICD-10-CM | POA: Diagnosis not present

## 2021-09-02 DIAGNOSIS — R0602 Shortness of breath: Secondary | ICD-10-CM | POA: Insufficient documentation

## 2021-09-02 LAB — CBC
HCT: 35.2 % — ABNORMAL LOW (ref 36.0–46.0)
Hemoglobin: 11.9 g/dL — ABNORMAL LOW (ref 12.0–15.0)
MCH: 23.7 pg — ABNORMAL LOW (ref 26.0–34.0)
MCHC: 33.8 g/dL (ref 30.0–36.0)
MCV: 70 fL — ABNORMAL LOW (ref 80.0–100.0)
Platelets: 447 10*3/uL — ABNORMAL HIGH (ref 150–400)
RBC: 5.03 MIL/uL (ref 3.87–5.11)
RDW: 15.5 % (ref 11.5–15.5)
WBC: 8.7 10*3/uL (ref 4.0–10.5)
nRBC: 0 % (ref 0.0–0.2)

## 2021-09-02 LAB — POC URINE PREG, ED: Preg Test, Ur: NEGATIVE

## 2021-09-02 NOTE — ED Triage Notes (Signed)
Pt had tooth pulled earlier this week, pt states she started having SOB and heaviness on her chest with exertion for the past few days. Pt states she starts to breathe quickly when she moves. Pt states she has a dry cough.

## 2021-09-03 ENCOUNTER — Emergency Department
Admission: EM | Admit: 2021-09-03 | Discharge: 2021-09-03 | Disposition: A | Discharge status: Home / Self Care | Payer: Medicaid Other | Attending: Emergency Medicine | Admitting: Emergency Medicine

## 2021-09-03 DIAGNOSIS — Z20822 Contact with and (suspected) exposure to covid-19: Secondary | ICD-10-CM | POA: Diagnosis not present

## 2021-09-03 DIAGNOSIS — R0602 Shortness of breath: Secondary | ICD-10-CM

## 2021-09-03 LAB — RESP PANEL BY RT-PCR (FLU A&B, COVID) ARPGX2
Influenza A by PCR: NEGATIVE
Influenza B by PCR: NEGATIVE
SARS Coronavirus 2 by RT PCR: NEGATIVE

## 2021-09-03 LAB — D-DIMER, QUANTITATIVE: D-Dimer, Quant: 0.46 ug/mL-FEU (ref 0.00–0.50)

## 2021-09-03 LAB — BASIC METABOLIC PANEL
Anion gap: 11 (ref 5–15)
BUN: 8 mg/dL (ref 6–20)
CO2: 22 mmol/L (ref 22–32)
Calcium: 9.2 mg/dL (ref 8.9–10.3)
Chloride: 101 mmol/L (ref 98–111)
Creatinine, Ser: 0.69 mg/dL (ref 0.44–1.00)
GFR, Estimated: >60 mL/min (ref >60)
Glucose, Bld: 100 mg/dL — ABNORMAL HIGH (ref 70–99)
Potassium: 3.5 mmol/L (ref 3.5–5.1)
Sodium: 134 mmol/L — ABNORMAL LOW (ref 135–145)

## 2021-09-03 LAB — TROPONIN I (HIGH SENSITIVITY)
Troponin I (High Sensitivity): 2 ng/L (ref <18)
Troponin I (High Sensitivity): 3 ng/L (ref <18)

## 2021-09-03 NOTE — Discharge Instructions (Addendum)
Your blood tests and chest xray were all normal today. Please continue to monitor your symptoms and follow up with your doctor.  Please follow up with your dentist in 2 days if your facial pain has not improved.

## 2021-09-03 NOTE — ED Provider Notes (Signed)
[facility] Emergency Department Provider Note  ____________________________________________  Time seen: Approximately 10:49 AM  I have reviewed the triage vital signs and the nursing notes.   HISTORY  Chief Complaint Shortness of Breath    HPI Stacy Weaver is a 40 y.o. female with a past history of sickle cell trait who comes ED complaining of shortness of breath for the past few days ever since having a tooth extraction 5 days ago.  Feels worse with movement.  No chest pain.  No dizziness or syncope.  No vomiting or fever.  Symptoms are intermittent, no alleviating factors.  Also has persistent left facial pain around the area of her left upper molar extraction.    Past Medical History:  Diagnosis Date   Sickle cell trait (HCC)      There are no problems to display for this patient.    Past Surgical History:  Procedure Laterality Date   TOOTH EXTRACTION       Prior to Admission medications   Medication Sig Start Date End Date Taking? Authorizing Provider  acetaminophen (TYLENOL) 325 MG tablet Take 650 mg by mouth every 6 (six) hours as needed.    [provider]  azithromycin (ZITHROMAX) 250 MG tablet Take 1 tablet (250 mg total) by mouth daily. Take first 2 tablets together, then 1 every day until finished. Patient not taking: Reported on 09/03/2021 08/26/13   Rodolph Bong, MD  diphenhydrAMINE (BENADRYL) 25 mg capsule Take 25 mg by mouth every 6 (six) hours as needed.    [provider]  ferrous sulfate 325 (65 FE) MG tablet Take 1 tablet (325 mg total) by mouth daily with breakfast. 06/26/12 06/26/13  Tracey Harries, MD  ibuprofen (ADVIL,MOTRIN) 200 MG tablet Take 200 mg by mouth every 6 (six) hours as needed.    [provider]  ipratropium (ATROVENT) 0.03 % nasal spray Place 2 sprays into the nose every 12 (twelve) hours. 08/26/13   Rodolph Bong, MD  metroNIDAZOLE (FLAGYL) 500 MG tablet Take 1 tablet by mouth in  the morning and at bedtime. 04/10/21   [provider]  Prenatal Vit-Fe Fumarate-FA (PRENATAL MULTIVITAMIN) TABS Take 1 tablet by mouth every morning.    [provider]     Allergies Latex and Nsaids   History reviewed. No pertinent family history.  Social History Social History   Tobacco Use   Smoking status: Never  Substance Use Topics   Alcohol use: No   Drug use: No    Review of Systems  Constitutional:   No fever or chills.  ENT:   No sore throat. No rhinorrhea.  Left jaw pain Cardiovascular:   No chest pain or syncope. Respiratory: Positive shortness of breath and nonproductive cough. Gastrointestinal:   Negative for abdominal pain, vomiting and diarrhea.  Musculoskeletal:   Negative for focal pain or swelling All other systems reviewed and are negative except as documented above in ROS and HPI.  ____________________________________________   PHYSICAL EXAM:  VITAL SIGNS: ED Triage Vitals  Enc Vitals Group     BP 09/02/21 2313 133/88     Pulse Rate 09/02/21 2313 (!) 129     Resp 09/02/21 2313 20     Temp 09/02/21 2313 98.4 F (36.9 C)     Temp Source 09/02/21 2313 Oral     SpO2 09/02/21 2313 99 %     Weight 09/02/21 2315 211 lb (95.7 kg)     Height 09/02/21 2315 5\' 5"  (1.651 m)  Head Circumference --      Peak Flow --      Pain Score 09/02/21 2314 7     Pain Loc --      Pain Edu? --      Excl. in GC? --     Vital signs reviewed, nursing assessments reviewed.   Constitutional:   Alert and oriented. Non-toxic appearance. Eyes:   Conjunctivae are normal. EOMI. PERRL. ENT      Head:   Normocephalic and atraumatic.      Nose: Normal      Mouth/Throat:   No gingival swelling or intraoral mass.  No evidence of infection, no purulent drainage.  There is exposed bone in the extraction site.      Neck:   No meningismus. Full ROM. Hematological/Lymphatic/Immunilogical:   No cervical lymphadenopathy. Cardiovascular:   RRR. Symmetric  bilateral radial and DP pulses.  No murmurs. Cap refill less than 2 seconds. Respiratory:   Normal respiratory effort without tachypnea/retractions. Breath sounds are clear and equal bilaterally. No wheezes/rales/rhonchi. Gastrointestinal:   Soft and nontender. Non distended. There is no CVA tenderness.  No rebound, rigidity, or guarding. Genitourinary:   deferred Musculoskeletal:   Normal range of motion in all extremities. No joint effusions.  No lower extremity tenderness.  No edema. Neurologic:   Normal speech and language.  Motor grossly intact. No acute focal neurologic deficits are appreciated.  Skin:    Skin is warm, dry and intact. No rash noted.  No petechiae, purpura, or bullae.  ____________________________________________    LABS (pertinent positives/negatives) (all labs ordered are listed, but only abnormal results are displayed) Labs Reviewed  BASIC METABOLIC PANEL - Abnormal; Notable for the following components:      Result Value   Sodium 134 (*)    Glucose, Bld 100 (*)    All other components within normal limits  CBC - Abnormal; Notable for the following components:   Hemoglobin 11.9 (*)    HCT 35.2 (*)    MCV 70.0 (*)    MCH 23.7 (*)    Platelets 447 (*)    All other components within normal limits  RESP PANEL BY RT-PCR (FLU A&B, COVID) ARPGX2  D-DIMER, QUANTITATIVE  POC URINE PREG, ED  TROPONIN I (HIGH SENSITIVITY)  TROPONIN I (HIGH SENSITIVITY)   ____________________________________________   EKG  Interpreted by me Sinus tachycardia rate 106.  Normal axis intervals QRS ST segments and T waves.  ____________________________________________    RADIOLOGY  DG Chest 2 View  Result Date: 09/02/2021 CLINICAL DATA:  Shortness of breath. EXAM: CHEST - 2 VIEW COMPARISON:  None. FINDINGS: Mild artifact in the right upper lung zone from overlying hair.The cardiomediastinal contours are normal. The lungs are clear. Pulmonary vasculature is normal. No  consolidation, pleural effusion, or pneumothorax. No acute osseous abnormalities are seen. IMPRESSION: No acute chest findings. Electronically Signed   By: Narda Rutherford M.D.   On: 09/02/2021 23:55    ____________________________________________   PROCEDURES Procedures  ____________________________________________  DIFFERENTIAL DIAGNOSIS   Dehydration, viral illness, pneumonia, pneumothorax, pleural effusion, pulmonary embolism, non-STEMI  CLINICAL IMPRESSION / ASSESSMENT AND PLAN / ED COURSE  Medications ordered in the ED: Medications - No data to display  Pertinent labs & imaging results that were available during my care of the patient were reviewed by me and considered in my medical decision making (see chart for details).  Stacy Weaver was evaluated in Emergency Department on 09/03/2021 for the symptoms described in the history of present illness.  She was evaluated in the context of the global COVID-19 pandemic, which necessitated consideration that the patient might be at risk for infection with the SARS-CoV-2 virus that causes COVID-19. Institutional protocols and algorithms that pertain to the evaluation of patients at risk for COVID-19 are in a state of rapid change based on information released by regulatory bodies including the CDC and federal and state organizations. These policies and algorithms were followed during the patient's care in the ED.   Patient presents with intermittent shortness of breath related to recent dental extraction.  In the treatment room her vital signs are normal, heart rate is in the 80s.  PERC rule is negative, no smoking, no exogenous hormones.  No high risk factors for VTE.  D-dimer negative.  Serial troponins negative.  Chest x-ray EKG and serum labs all unremarkable, stable for discharge to continue monitoring symptoms.  At this point, low suspicion for PE, dissection, pericarditis, ACS, sepsis/shock.       ____________________________________________   FINAL CLINICAL IMPRESSION(S) / ED DIAGNOSES    Final diagnoses:  SOB (shortness of breath)     ED Discharge Orders     None       Portions of this note were generated with dragon dictation software. Dictation errors may occur despite best attempts at proofreading.    Carrie Mew, MD 09/03/21 1053

## 2021-09-03 NOTE — ED Notes (Signed)
Pt presents to ED with c/o of having a tooth pulled this past Wednesday and now is having L sided pain in her face, chest pain and slight SOB. Pt denies fevers or chills. Pt sates some SOB on exertion. NAD noted. VSS.

## 2021-11-14 DIAGNOSIS — Z3202 Encounter for pregnancy test, result negative: Secondary | ICD-10-CM | POA: Diagnosis not present

## 2021-11-14 DIAGNOSIS — N898 Other specified noninflammatory disorders of vagina: Secondary | ICD-10-CM | POA: Diagnosis not present

## 2021-11-14 DIAGNOSIS — N760 Acute vaginitis: Secondary | ICD-10-CM | POA: Diagnosis not present

## 2021-12-26 ENCOUNTER — Encounter: Payer: Self-pay | Admitting: Obstetrics and Gynecology

## 2021-12-26 ENCOUNTER — Other Ambulatory Visit: Payer: Self-pay

## 2021-12-26 ENCOUNTER — Ambulatory Visit (INDEPENDENT_AMBULATORY_CARE_PROVIDER_SITE_OTHER): Payer: Medicaid Other | Admitting: Obstetrics and Gynecology

## 2021-12-26 ENCOUNTER — Other Ambulatory Visit (HOSPITAL_COMMUNITY)
Admission: RE | Admit: 2021-12-26 | Discharge: 2021-12-26 | Disposition: A | Discharge status: Home / Self Care | Payer: Medicaid Other | Source: Ambulatory Visit | Attending: Obstetrics and Gynecology | Admitting: Obstetrics and Gynecology

## 2021-12-26 VITALS — BP 117/70 | HR 81 | Resp 16 | Ht 65.0 in | Wt 212.2 lb

## 2021-12-26 DIAGNOSIS — Z131 Encounter for screening for diabetes mellitus: Secondary | ICD-10-CM

## 2021-12-26 DIAGNOSIS — Z0000 Encounter for general adult medical examination without abnormal findings: Secondary | ICD-10-CM

## 2021-12-26 DIAGNOSIS — Z13220 Encounter for screening for lipoid disorders: Secondary | ICD-10-CM

## 2021-12-26 DIAGNOSIS — Z124 Encounter for screening for malignant neoplasm of cervix: Secondary | ICD-10-CM | POA: Diagnosis not present

## 2021-12-26 DIAGNOSIS — E669 Obesity, unspecified: Secondary | ICD-10-CM

## 2021-12-26 DIAGNOSIS — Z01419 Encounter for gynecological examination (general) (routine) without abnormal findings: Secondary | ICD-10-CM | POA: Insufficient documentation

## 2021-12-26 DIAGNOSIS — N912 Amenorrhea, unspecified: Secondary | ICD-10-CM | POA: Diagnosis not present

## 2021-12-26 DIAGNOSIS — Z331 Pregnant state, incidental: Secondary | ICD-10-CM

## 2021-12-26 LAB — POCT URINE PREGNANCY: Preg Test, Ur: POSITIVE — AB

## 2021-12-26 NOTE — Progress Notes (Signed)
GYNECOLOGY ANNUAL PHYSICAL EXAM PROGRESS NOTE  Subjective:    Stacy Weaver is a 41 y.o. G11P2002 female who presents to establish for an annual exam. Last seen by GYN in 2019 with her last pregnancy. The patient has no complaints today. The patient is sexually active. The patient participates in regular exercise: yes (recently started in February). Has the patient ever been transfused or tattooed?: yes. The patient reports that there is not domestic violence in her life.   Patient complains today of absent menses x 3 months.  Notes that she took 3 pregnancy tests at home which were negative last month.  Notes that she uses natural family planning methods for contraception.   Menstrual History: Menarche age: 30 Patient's last menstrual period was 10/04/2021 (exact date). Period Duration (Days): 5 Period Pattern: (!) Irregular Menstrual Flow: Moderate Menstrual Control: Maxi pad Menstrual Control Change Freq (Hours): 1-2 Dysmenorrhea: (!) Mild Dysmenorrhea Symptoms: Cramping   Gynecologic History:  Contraception: none History of STI's: h/o trichomonas 2021.  Last Pap: 2019. Results were: normal.  Denies h/o abnormal pap smears. Last mammogram: Not age appropriate    Upstream - 12/26/21 1519       Pregnancy Intention Screening   Does the patient want to become pregnant in the next year? No    Does the patient's partner want to become pregnant in the next year? No    Would the patient like to discuss contraceptive options today? No      Contraception Wrap Up   Current Method FAM or LAM    End Method FAM or LAM    Contraception Counseling Provided No            The pregnancy intention screening data noted above was reviewed. Potential methods of contraception were discussed. The patient elected to proceed with FAM or LAM.   OB History  Gravida Para Term Preterm AB Living  2 2 2  0 0 2  SAB IAB Ectopic Multiple Live Births  0 0 0 0 1    # Outcome Date GA Lbr  Len/2nd Weight Sex Delivery Anes PTL Lv  2 Term 06/24/12 [redacted]w[redacted]d 21:32 / 00:10 7 lb 0.7 oz (3.195 kg) M Vag-Spont EPI  LIV     Name: Broxson,BOY Sheyna     Apgar1: 9  Apgar5: 9  1 Term             Past Medical History:  Diagnosis Date   Sickle cell trait (HCC)     Past Surgical History:  Procedure Laterality Date   TOOTH EXTRACTION      Family History  Problem Relation Age of Onset   Hyperlipidemia Father    Arthritis Maternal Grandmother    Kidney failure Paternal Grandmother    Colon cancer Neg Hx    Breast cancer Neg Hx    Cervical cancer Neg Hx     Social History   Socioeconomic History   Marital status: Single    Spouse name: Not on file   Number of children: Not on file   Years of education: Not on file   Highest education level: Not on file  Occupational History   Not on file  Tobacco Use   Smoking status: Never   Smokeless tobacco: Not on file  Substance and Sexual Activity   Alcohol use: No   Drug use: No   Sexual activity: Yes  Other Topics Concern   Not on file  Social History Narrative   Not on file  Social Determinants of Health   Financial Resource Strain: Not on file  Food Insecurity: Not on file  Transportation Needs: Not on file  Physical Activity: Not on file  Stress: Not on file  Social Connections: Not on file  Intimate Partner Violence: Not on file    Current Outpatient Medications on File Prior to Visit  Medication Sig Dispense Refill   acetaminophen (TYLENOL) 325 MG tablet Take 650 mg by mouth every 6 (six) hours as needed.     azithromycin (ZITHROMAX) 250 MG tablet Take 1 tablet (250 mg total) by mouth daily. Take first 2 tablets together, then 1 every day until finished. (Patient not taking: Reported on 09/03/2021) 6 tablet 0   diphenhydrAMINE (BENADRYL) 25 mg capsule Take 25 mg by mouth every 6 (six) hours as needed.     ferrous sulfate 325 (65 FE) MG tablet Take 1 tablet (325 mg total) by mouth daily with breakfast. 30 tablet  11   ibuprofen (ADVIL,MOTRIN) 200 MG tablet Take 200 mg by mouth every 6 (six) hours as needed.     ipratropium (ATROVENT) 0.03 % nasal spray Place 2 sprays into the nose every 12 (twelve) hours. 30 mL 1   metroNIDAZOLE (FLAGYL) 500 MG tablet Take 1 tablet by mouth in the morning and at bedtime.     Prenatal Vit-Fe Fumarate-FA (PRENATAL MULTIVITAMIN) TABS Take 1 tablet by mouth every morning.     No current facility-administered medications on file prior to visit.    Allergies  Allergen Reactions   Latex Other (See Comments)    irritation   Nsaids      Review of Systems Constitutional: negative for chills, fatigue, fevers and sweats Eyes: negative for irritation, redness and visual disturbance Ears, nose, mouth, throat, and face: negative for hearing loss, nasal congestion, snoring and tinnitus Respiratory: negative for asthma, cough, sputum Cardiovascular: negative for chest pain, dyspnea, exertional chest pressure/discomfort, irregular heart beat, palpitations and syncope Gastrointestinal: negative for abdominal pain, change in bowel habits, nausea and vomiting Genitourinary: negative for abnormal menstrual periods, genital lesions, sexual problems and vaginal discharge, dysuria and urinary incontinence Integument/breast: negative for breast lump, breast tenderness and nipple discharge Hematologic/lymphatic: negative for bleeding and easy bruising Musculoskeletal:negative for back pain and muscle weakness Neurological: negative for dizziness, headaches, vertigo and weakness Endocrine: negative for diabetic symptoms including polydipsia, polyuria and skin dryness Allergic/Immunologic: negative for hay fever and urticaria      Objective:  Blood pressure 117/70, pulse 81, resp. rate 16, height 5\' 5"  (1.651 m), weight 212 lb 3.2 oz (96.3 kg), last menstrual period 10/04/2021.  Body mass index is 35.31 kg/m.  General Appearance:    Alert, cooperative, no distress, appears stated  age, moderate obesity  Head:    Normocephalic, without obvious abnormality, atraumatic  Eyes:    PERRL, conjunctiva/corneas clear, EOM's intact, both eyes  Ears:    Normal external ear canals, both ears  Nose:   Nares normal, septum midline, mucosa normal, no drainage or sinus tenderness  Throat:   Lips, mucosa, and tongue normal; teeth and gums normal  Neck:   Supple, symmetrical, trachea midline, no adenopathy; thyroid: no enlargement/tenderness/nodules; no carotid bruit or JVD  Back:     Symmetric, no curvature, ROM normal, no CVA tenderness  Lungs:     Clear to auscultation bilaterally, respirations unlabored  Chest Wall:    No tenderness or deformity   Heart:    Regular rate and rhythm, S1 and S2 normal, no murmur, rub or gallop  Breast Exam:    No tenderness, masses, or nipple abnormality  Abdomen:     Soft, non-tender, bowel sounds active all four quadrants, no masses, no organomegaly.    Genitalia:    Pelvic:external genitalia normal, vagina without lesions, discharge, or tenderness, rectovaginal septum  normal. Cervix normal in appearance, no cervical motion tenderness, no adnexal masses or tenderness.  Uterus normal size, shape, mobile, regular contours, nontender.  Rectal:    Normal external sphincter.  No hemorrhoids appreciated. Internal exam not done.   Extremities:   Extremities normal, atraumatic, no cyanosis or edema  Pulses:   2+ and symmetric all extremities  Skin:   Skin color, texture, turgor normal, no rashes or lesions  Lymph nodes:   Cervical, supraclavicular, and axillary nodes normal  Neurologic:   CNII-XII intact, normal strength, sensation and reflexes throughout   .  Labs:  Lab Results  Component Value Date   WBC 8.7 09/02/2021   HGB 11.9 (L) 09/02/2021   HCT 35.2 (L) 09/02/2021   MCV 70.0 (L) 09/02/2021   PLT 447 (H) 09/02/2021    Lab Results  Component Value Date   CREATININE 0.69 09/02/2021   BUN 8 09/02/2021   NA 134 (L) 09/02/2021   K 3.5  09/02/2021   CL 101 09/02/2021   CO2 22 09/02/2021    No results found for: ALT, AST, GGT, ALKPHOS, BILITOT  No results found for: TSH   Assessment:   1. Well woman exam with routine gynecological exam   2. Amenorrhea   3. Screening for diabetes mellitus   4. Cervical cancer screening   5. Screening for lipid disorders   6. Pregnancy test positive for incidental pregnancy   7. Obesity (BMI 35.0-39.9 without comorbidity)      Plan:  Blood tests: see orders. Breast self exam technique reviewed and patient encouraged to perform self-exam monthly. Contraception: rhythm method. Discussed healthy lifestyle modifications. Mammogram  Not age appropriate . To begin screenings at age 32.  Pap smear ordered. Amenorrhea x 3 months, pregnancy test positive today. Patient notes 3 neg tests within the last month. Will get BHCG level.  Discussed desires for pregnancy, patient unsure at this time.  Follow up in 1 year for annual exam.  Will follow up sooner to establish viability of pregnancy if confirmed.    Hildred Laser, MD [facility]

## 2021-12-26 NOTE — Patient Instructions (Incomplete)
Breast Self-Awareness °Breast self-awareness is knowing how your breasts look and feel. Doing breast self-awareness is important. It allows you to catch a breast problem early while it is still small and can be treated. All women should do breast self-awareness, including women who have had breast implants. Tell your doctor if you notice a change in your breasts. °What you need: °A mirror. °A well-lit room. °How to do a breast self-exam °A breast self-exam is one way to learn what is normal for your breasts and to check for changes. To do a breast self-exam: °Look for changes ° °Take off all the clothes above your waist. °Stand in front of a mirror in a room with good lighting. °Put your hands on your hips. °Push your hands down. °Look at your breasts and nipples in the mirror to see if one breast or nipple looks different from the other. Check to see if: °The shape of one breast is different. °The size of one breast is different. °There are wrinkles, dips, and bumps in one breast and not the other. °Look at each breast for changes in the skin, such as: °Redness. °Scaly areas. °Look for changes in your nipples, such as: °Liquid around the nipples. °Bleeding. °Dimpling. °Redness. °A change in where the nipples are. °Feel for changes ° °Lie on your back on the floor. °Feel each breast. To do this, follow these steps: °Pick a breast to feel. °Put the arm closest to that breast above your head. °Use your other arm to feel the nipple area of your breast. Feel the area with the pads of your three middle fingers by making small circles with your fingers. For the first circle, press lightly. For the second circle, press harder. For the third circle, press even harder. °Keep making circles with your fingers at the different pressures as you move down your breast. Stop when you feel your ribs. °Move your fingers a little toward the center of your body. °Start making circles with your fingers again, this time going up until  you reach your collarbone. °Keep making up-and-down circles until you reach your armpit. Remember to keep using the three pressures. °Feel the other breast in the same way. °Sit or stand in the tub or shower. °With soapy water on your skin, feel each breast the same way you did in step 2 when you were lying on the floor. °Write down what you find °Writing down what you find can help you remember what to tell your doctor. Write down: °What is normal for each breast. °Any changes you find in each breast, including: °The kind of changes you find. °Whether you have pain. °Size and location of any lumps. °When you last had your menstrual period. °General tips °Check your breasts every month. °If you are breastfeeding, the best time to check your breasts is after you feed your baby or after you use a breast pump. °If you get menstrual periods, the best time to check your breasts is 5-7 days after your menstrual period is over. °With time, you will become comfortable with the self-exam, and you will begin to know if there are changes in your breasts. °Contact a doctor if you: °See a change in the shape or size of your breasts or nipples. °See a change in the skin of your breast or nipples, such as red or scaly skin. °Have fluid coming from your nipples that is not normal. °Find a lump or thick area that was not there before. °Have pain in   your breasts. °Have any concerns about your breast health. °Summary °Breast self-awareness includes looking for changes in your breasts, as well as feeling for changes within your breasts. °Breast self-awareness should be done in front of a mirror in a well-lit room. °You should check your breasts every month. If you get menstrual periods, the best time to check your breasts is 5-7 days after your menstrual period is over. °Let your doctor know of any changes you see in your breasts, including changes in size, changes on the skin, pain or tenderness, or fluid from your nipples that is not  normal. °This information is not intended to replace advice given to you by your health care provider. Make sure you discuss any questions you have with your health care provider. °Document Revised: 06/02/2018 Document Reviewed: 06/02/2018 °Elsevier Patient Education © 2022 [employer]vier Inc. °Preventive Care 21-39 Years Old, Female °Preventive care refers to lifestyle choices and visits with your health care provider that can promote health and wellness. Preventive care visits are also called wellness exams. °What can I expect for my preventive care visit? °Counseling °During your preventive care visit, your health care provider may ask about your: °Medical history, including: °Past medical problems. °Family medical history. °Pregnancy history. °Current health, including: °Menstrual cycle. °Method of birth control. °Emotional well-being. °Home life and relationship well-being. °Sexual activity and sexual health. °Lifestyle, including: °Alcohol, nicotine or tobacco, and drug use. °Access to firearms. °Diet, exercise, and sleep habits. °Work and work environment. °Sunscreen use. °Safety issues such as seatbelt and bike helmet use. °Physical exam °Your health care provider may check your: °Height and weight. These may be used to calculate your BMI (body mass index). BMI is a measurement that tells if you are at a healthy weight. °Waist circumference. This measures the distance around your waistline. This measurement also tells if you are at a healthy weight and may help predict your risk of certain diseases, such as type 2 diabetes and high blood pressure. °Heart rate and blood pressure. °Body temperature. °Skin for abnormal spots. °What immunizations do I need? °Vaccines are usually given at various ages, according to a schedule. Your health care provider will recommend vaccines for you based on your age, medical history, and lifestyle or other factors, such as travel or where you work. °What tests do I  need? °Screening °Your health care provider may recommend screening tests for certain conditions. This may include: °Pelvic exam and Pap test. °Lipid and cholesterol levels. °Diabetes screening. This is done by checking your blood sugar (glucose) after you have not eaten for a while (fasting). °Hepatitis B test. °Hepatitis C test. °HIV (human immunodeficiency virus) test. °STI (sexually transmitted infection) testing, if you are at risk. °BRCA-related cancer screening. This may be done if you have a family history of breast, ovarian, tubal, or peritoneal cancers. °Talk with your health care provider about your test results, treatment options, and if necessary, the need for more tests. °Follow these instructions at home: °Eating and drinking ° °Eat a healthy diet that includes fresh fruits and vegetables, whole grains, lean protein, and low-fat dairy products. °Take vitamin and mineral supplements as recommended by your health care provider. °Do not drink alcohol if: °Your health care provider tells you not to drink. °You are pregnant, may be pregnant, or are planning to become pregnant. °If you drink alcohol: °Limit how much you have to 0-1 drink a day. °Know how much alcohol is in your drink. In the [place] one drink equals one 12 oz   bottle of beer (355 mL), one 5 oz glass of wine (148 mL), or one 1 oz glass of hard liquor (44 mL). Lifestyle Brush your teeth every morning and night with fluoride toothpaste. Floss one time each day. Exercise for at least 30 minutes 5 or more days each week. Do not use any products that contain nicotine or tobacco. These products include cigarettes, chewing tobacco, and vaping devices, such as e-cigarettes. If you need help quitting, ask your health care provider. Do not use drugs. If you are sexually active, practice safe sex. Use a condom or other form of protection to prevent STIs. If you do not wish to become pregnant, use a form of birth control. If you plan to become  pregnant, see your health care provider for a prepregnancy visit. Find healthy ways to manage stress, such as: Meditation, yoga, or listening to music. Journaling. Talking to a trusted person. Spending time with friends and family. Minimize exposure to UV radiation to reduce your risk of skin cancer. Safety Always wear your seat belt while driving or riding in a vehicle. Do not drive: If you have been drinking alcohol. Do not ride with someone who has been drinking. If you have been using any mind-altering substances or drugs. While texting. When you are tired or distracted. Wear a helmet and other protective equipment during sports activities. If you have firearms in your house, make sure you follow all gun safety procedures. Seek help if you have been physically or sexually abused. What's next? Go to your health care provider once a year for an annual wellness visit. Ask your health care provider how often you should have your eyes and teeth checked. Stay up to date on all vaccines. This information is not intended to replace advice given to you by your health care provider. Make sure you discuss any questions you have with your health care provider. Document Revised: 04/11/2021 Document Reviewed: 04/11/2021 Elsevier Patient Education  [place].

## 2021-12-27 ENCOUNTER — Telehealth: Payer: Self-pay | Admitting: Obstetrics and Gynecology

## 2021-12-27 ENCOUNTER — Encounter: Payer: Self-pay | Admitting: Obstetrics and Gynecology

## 2021-12-27 LAB — COMPREHENSIVE METABOLIC PANEL
ALT: 20 IU/L (ref 0–32)
AST: 22 IU/L (ref 0–40)
Albumin/Globulin Ratio: 1.2 (ref 1.2–2.2)
Albumin: 4.1 g/dL (ref 3.8–4.8)
Alkaline Phosphatase: 62 IU/L (ref 44–121)
BUN/Creatinine Ratio: 14 (ref 9–23)
BUN: 11 mg/dL (ref 6–20)
Bilirubin Total: 0.2 mg/dL (ref 0.0–1.2)
CO2: 21 mmol/L (ref 20–29)
Calcium: 9.8 mg/dL (ref 8.7–10.2)
Chloride: 101 mmol/L (ref 96–106)
Creatinine, Ser: 0.77 mg/dL (ref 0.57–1.00)
Globulin, Total: 3.4 g/dL (ref 1.5–4.5)
Glucose: 89 mg/dL (ref 70–99)
Potassium: 4.1 mmol/L (ref 3.5–5.2)
Sodium: 136 mmol/L (ref 134–144)
Total Protein: 7.5 g/dL (ref 6.0–8.5)
eGFR: 101 mL/min/{1.73_m2} (ref >59)

## 2021-12-27 LAB — CBC
Hematocrit: 34.5 % (ref 34.0–46.6)
Hemoglobin: 11.3 g/dL (ref 11.1–15.9)
MCH: 23.7 pg — ABNORMAL LOW (ref 26.6–33.0)
MCHC: 32.8 g/dL (ref 31.5–35.7)
MCV: 72 fL — ABNORMAL LOW (ref 79–97)
Platelets: 499 10*3/uL — ABNORMAL HIGH (ref 150–450)
RBC: 4.77 x10E6/uL (ref 3.77–5.28)
RDW: 16.1 % — ABNORMAL HIGH (ref 11.7–15.4)
WBC: 6.9 10*3/uL (ref 3.4–10.8)

## 2021-12-27 LAB — LIPID PANEL
Chol/HDL Ratio: 4.2 ratio (ref 0.0–4.4)
Cholesterol, Total: 237 mg/dL — ABNORMAL HIGH (ref 100–199)
HDL: 56 mg/dL (ref >39)
LDL Chol Calc (NIH): 168 mg/dL — ABNORMAL HIGH (ref 0–99)
Triglycerides: 75 mg/dL (ref 0–149)
VLDL Cholesterol Cal: 13 mg/dL (ref 5–40)

## 2021-12-27 LAB — BETA HCG QUANT (REF LAB): hCG Quant: 244 m[IU]/mL

## 2021-12-27 LAB — HEMOGLOBIN A1C
Est. average glucose Bld gHb Est-mCnc: 103 mg/dL
Hgb A1c MFr Bld: 5.2 % (ref 4.8–5.6)

## 2021-12-27 LAB — TSH: TSH: 1.28 u[IU]/mL (ref 0.450–4.500)

## 2021-12-27 NOTE — Telephone Encounter (Signed)
Pt called and wanted to speak with physician about her test results she received today. Informed patient to allow at least 24 to 48 hours for a physician to reach out and explain results to her and/or next steps.  ?

## 2021-12-27 NOTE — Addendum Note (Signed)
Addended by: Fabian November on: 12/27/2021 01:14 PM ? ? Modules accepted: Orders ? ?

## 2021-12-27 NOTE — Telephone Encounter (Signed)
Spoke with patient. Patient is aware of next steps. ?

## 2021-12-28 ENCOUNTER — Other Ambulatory Visit: Payer: Self-pay

## 2021-12-28 ENCOUNTER — Other Ambulatory Visit: Payer: Medicaid Other

## 2021-12-28 DIAGNOSIS — N912 Amenorrhea, unspecified: Secondary | ICD-10-CM | POA: Diagnosis not present

## 2021-12-29 LAB — BETA HCG QUANT (REF LAB): hCG Quant: 453 m[IU]/mL

## 2022-01-01 LAB — CYTOLOGY - PAP
Comment: NEGATIVE
Diagnosis: NEGATIVE
High risk HPV: NEGATIVE

## 2022-01-03 ENCOUNTER — Telehealth: Payer: Self-pay | Admitting: Obstetrics and Gynecology

## 2022-01-03 NOTE — Telephone Encounter (Signed)
Called patient she said she had some red blood when she wipes and a small clot. I advised her to continue to monitor her bleeding if she begins to bleed more like a period and she starts cramping to call back. She denied cramping. Please advise. ?

## 2022-01-03 NOTE — Telephone Encounter (Signed)
Pt called and stated that she has been having some spotting since this morning around 7am. Pt stated it is red in color. Stated she think she is around 6weeks preg. Please advise.  ?

## 2022-01-04 ENCOUNTER — Emergency Department: Payer: Medicaid Other

## 2022-01-04 ENCOUNTER — Telehealth: Payer: Self-pay | Admitting: Obstetrics and Gynecology

## 2022-01-04 ENCOUNTER — Other Ambulatory Visit: Payer: Self-pay

## 2022-01-04 ENCOUNTER — Emergency Department
Admission: EM | Admit: 2022-01-04 | Discharge: 2022-01-04 | Disposition: A | Discharge status: Home / Self Care | Payer: Medicaid Other | Attending: Emergency Medicine | Admitting: Emergency Medicine

## 2022-01-04 DIAGNOSIS — O209 Hemorrhage in early pregnancy, unspecified: Secondary | ICD-10-CM | POA: Diagnosis not present

## 2022-01-04 DIAGNOSIS — Z3A01 Less than 8 weeks gestation of pregnancy: Secondary | ICD-10-CM | POA: Diagnosis not present

## 2022-01-04 DIAGNOSIS — N939 Abnormal uterine and vaginal bleeding, unspecified: Secondary | ICD-10-CM | POA: Diagnosis not present

## 2022-01-04 DIAGNOSIS — O200 Threatened abortion: Secondary | ICD-10-CM | POA: Insufficient documentation

## 2022-01-04 LAB — CBC WITH DIFFERENTIAL/PLATELET
Abs Immature Granulocytes: 0.01 10*3/uL (ref 0.00–0.07)
Basophils Absolute: 0 10*3/uL (ref 0.0–0.1)
Basophils Relative: 0 %
Eosinophils Absolute: 0 10*3/uL (ref 0.0–0.5)
Eosinophils Relative: 1 %
HCT: 33.8 % — ABNORMAL LOW (ref 36.0–46.0)
Hemoglobin: 11.3 g/dL — ABNORMAL LOW (ref 12.0–15.0)
Immature Granulocytes: 0 %
Lymphocytes Relative: 33 %
Lymphs Abs: 2.2 10*3/uL (ref 0.7–4.0)
MCH: 23.7 pg — ABNORMAL LOW (ref 26.0–34.0)
MCHC: 33.4 g/dL (ref 30.0–36.0)
MCV: 71 fL — ABNORMAL LOW (ref 80.0–100.0)
Monocytes Absolute: 0.5 10*3/uL (ref 0.1–1.0)
Monocytes Relative: 7 %
Neutro Abs: 4 10*3/uL (ref 1.7–7.7)
Neutrophils Relative %: 59 %
Platelets: 471 10*3/uL — ABNORMAL HIGH (ref 150–400)
RBC: 4.76 MIL/uL (ref 3.87–5.11)
RDW: 15.9 % — ABNORMAL HIGH (ref 11.5–15.5)
WBC: 6.7 10*3/uL (ref 4.0–10.5)
nRBC: 0 % (ref 0.0–0.2)

## 2022-01-04 LAB — BASIC METABOLIC PANEL
Anion gap: 7 (ref 5–15)
BUN: <5 mg/dL — ABNORMAL LOW (ref 6–20)
CO2: 24 mmol/L (ref 22–32)
Calcium: 9.1 mg/dL (ref 8.9–10.3)
Chloride: 107 mmol/L (ref 98–111)
Creatinine, Ser: 0.72 mg/dL (ref 0.44–1.00)
GFR, Estimated: >60 mL/min (ref >60)
Glucose, Bld: 93 mg/dL (ref 70–99)
Potassium: 3.8 mmol/L (ref 3.5–5.1)
Sodium: 138 mmol/L (ref 135–145)

## 2022-01-04 LAB — HCG, QUANTITATIVE, PREGNANCY: hCG, Beta Chain, Quant, S: 631 m[IU]/mL — ABNORMAL HIGH (ref <5)

## 2022-01-04 NOTE — ED Notes (Signed)
RN first encounter with pt prior to discharge. Pt verbalized understanding of discharge instructions and follow-up care instructions. Pt advised if symptoms worsen to return to ED. E-signature not available due to signature pad not working properly.  ?

## 2022-01-04 NOTE — ED Provider Notes (Signed)
? ?[facility] ?Provider Note ? ? ? Event Date/Time  ? First MD Initiated Contact with Patient 01/04/22 1122   ?  (approximate) ? ? ?History  ? ?Vaginal Bleeding ? ? ?HPI ? ?Stacy Weaver is a 41 y.o. female G4P3 with LMP 10/04/2021 who presents with vaginal bleeding and pelvic cramping over the last day.  The patient reports that the bleeding started yesterday and then she started having cramping today.  The bleeding is moderate in intensity.  She denies any associated nausea or vomiting, fever or chills, or urinary symptoms. ? ? ? ?Physical Exam  ? ?Triage Vital Signs: ?ED Triage Vitals  ?Enc Vitals Group  ?   BP 01/04/22 1127 124/77  ?   Pulse Rate 01/04/22 1127 85  ?   Resp 01/04/22 1127 16  ?   Temp 01/04/22 1127 97.9 ?F (36.6 ?C)  ?   Temp Source 01/04/22 1127 Oral  ?   SpO2 01/04/22 1127 95 %  ?   Weight 01/04/22 1124 205 lb (93 kg)  ?   Height 01/04/22 1124 5\' 5"  (1.651 m)  ?   Head Circumference --   ?   Peak Flow --   ?   Pain Score 01/04/22 1124 8  ?   Pain Loc --   ?   Pain Edu? --   ?   Excl. in GC? --   ? ? ?Most recent vital signs: ?Vitals:  ? 01/04/22 1127 01/04/22 1430  ?BP: 124/77 113/67  ?Pulse: 85 74  ?Resp: 16 16  ?Temp: 97.9 ?F (36.6 ?C) 97.9 ?F (36.6 ?C)  ?SpO2: 95% 95%  ? ? ? ?General: Awake, no distress.  ?CV:  Good peripheral perfusion.  ?Resp:  Normal effort.  ?Abd:  Soft and nontender.  No distention.  ?Other:  Cervical os closed on pelvic exam. ? ? ?ED Results / Procedures / Treatments  ? ?Labs ?(all labs ordered are listed, but only abnormal results are displayed) ?Labs Reviewed  ?CBC WITH DIFFERENTIAL/PLATELET - Abnormal; Notable for the following components:  ?    Result Value  ? Hemoglobin 11.3 (*)   ? HCT 33.8 (*)   ? MCV 71.0 (*)   ? MCH 23.7 (*)   ? RDW 15.9 (*)   ? Platelets 471 (*)   ? All other components within normal limits  ?BASIC METABOLIC PANEL - Abnormal; Notable for the following components:  ? BUN <5 (*)   ? All other components within normal  limits  ?HCG, QUANTITATIVE, PREGNANCY - Abnormal; Notable for the following components:  ? hCG, Beta Chain, Quant, S 631 (*)   ? All other components within normal limits  ?POC URINE PREG, ED  ?TYPE AND SCREEN  ? ? ? ?EKG ? ? ? ? ?RADIOLOGY ? ?03/06/22 OB: I independently viewed and interpreted the images; small gestational sac with yolk sac present.  No FHR. ? ?PROCEDURES: ? ?Critical Care performed: No ? ?Procedures ? ? ?MEDICATIONS ORDERED IN ED: ?Medications - No data to display ? ? ?IMPRESSION / MDM / ASSESSMENT AND PLAN / ED COURSE  ?I reviewed the triage vital signs and the nursing notes. ? ?41 year old female G4P3 with LMP in early December presents with cramping and vaginal bleeding over the last day. ? ?On exam the patient is well-appearing and her vital signs are normal.  The abdomen is soft and nontender. ? ?Differential diagnosis includes, but is not limited to, threatened miscarriage, incomplete, ectopic pregnancy, subchorionic hemorrhage, other abnormal uterine  bleeding. ? ?We will obtain lab work-up, ultrasound, and reassess. ? ?----------------------------------------- ?2:55 PM on 01/04/2022 ?----------------------------------------- ? ?Ultrasound shows a small gestational sac and yolk sac, confirming IUP but not viability.  hCG is 631.  CBC shows mild anemia consistent with the patient's baseline.  BMP is normal. ? ?Cervical os was closed on my pelvic exam. ? ?I consulted Dr. Logan Bores from OB/GYN.  He recommends close outpatient follow-up.  The patient does have an appointment scheduled for early next week.  And have also sent a secure chat message to the patient's primary OB/GYN Dr. Valentino Saxon. ? ?At this time, the patient is stable for discharge home.  I counseled her on the results of the work-up and on return precautions.  She expressed understanding. ? ? ?FINAL CLINICAL IMPRESSION(S) / ED DIAGNOSES  ? ?Final diagnoses:  ?Vaginal bleeding  ?Threatened miscarriage  ? ? ? ?Rx / DC Orders  ? ?ED Discharge  Orders   ? ? None  ? ?  ? ? ? ?Note:  This document was prepared using Dragon voice recognition software and may include unintentional dictation errors.  ?  Dionne Bucy, MD ?01/04/22 1456 ? ?

## 2022-01-04 NOTE — Telephone Encounter (Signed)
Pt called back asking if doctor had responded to previous call- states that pain is worse- bleeding and cramping worsening- I consulted clinical staff opt rates pain 8/10- we advised pt to go to ER to be evaluated.  ?

## 2022-01-04 NOTE — ED Notes (Signed)
See triage note. Pt to ED for vaginal bleeding since yesterday, same amount as her normal period with no saturated pads but some small clots, and cramping lower abdominal pain since this morning. Pt had negative pregnancy tests in January, LMP was 12/22, no events in February, then had appt with PCP on 3/1 and had positive UA preg that day. ?

## 2022-01-04 NOTE — Telephone Encounter (Signed)
Patient called back this morning. She reports that her bleeding has increased and she is cramping pretty bad.  Would you like patient to come in to be seen in office or does she need to go to ED for evaluation. Please advise. ?

## 2022-01-04 NOTE — ED Triage Notes (Signed)
Patient to ER via [facility] with complaints of vaginal bleeding that started yesterday. Reports being approx [redacted] weeks pregnant. Patient reports presence of clots, states bleeding similar to regular periods. Patient reports lower abdominal cramping. Denies NVD/ urinary symptoms.  ? ?[facility] through [facility].  ?

## 2022-01-04 NOTE — Discharge Instructions (Signed)
Return to the ER for new, worsening, or persistent severe bleeding, pain or cramping, fever, weakness or lightheadedness, or any other new or worsening symptoms that concern you.  We have contacted [facility] about your ED visit.  You should call the office on Monday and follow-up next week. ?

## 2022-01-04 NOTE — ED Notes (Addendum)
Ent extra green tube for Hcg if ordered, and pink tube in case ABO Rh typing ordered. ?

## 2022-01-07 NOTE — Telephone Encounter (Signed)
I spoke with patient. She said that it has been a ruff weekend. She has an appointment to see you on Wednesday. Would you like her to come in ahead of time for a beta quant. ?

## 2022-01-07 NOTE — Telephone Encounter (Signed)
She can come in tomorrow for a quant and then I'll se her on Wednesday. Patient was seen in the ER over the weekend.

## 2022-01-08 ENCOUNTER — Other Ambulatory Visit: Payer: Medicaid Other

## 2022-01-08 ENCOUNTER — Other Ambulatory Visit: Payer: Self-pay

## 2022-01-08 DIAGNOSIS — O209 Hemorrhage in early pregnancy, unspecified: Secondary | ICD-10-CM

## 2022-01-09 ENCOUNTER — Ambulatory Visit (INDEPENDENT_AMBULATORY_CARE_PROVIDER_SITE_OTHER): Payer: Medicaid Other

## 2022-01-09 DIAGNOSIS — N912 Amenorrhea, unspecified: Secondary | ICD-10-CM | POA: Diagnosis not present

## 2022-01-09 LAB — BETA HCG QUANT (REF LAB): hCG Quant: 36 m[IU]/mL

## 2022-02-04 NOTE — Progress Notes (Signed)
? ? ?  GYNECOLOGY PROGRESS NOTE ? ?Subjective:  ? ? Patient ID: Stacy Weaver, female    DOB: 1981/05/06, 41 y.o.   MRN: [id number] ? ?HPI ? Patient is a 41 y.o. G54P3001 female who presents for contraception management. Patient had a miscarriage on 01/04/2022. Is considering OCPs.  Does request a lower dose estrogen due to occasionally having migraines. Patient's last menstrual period was 02/01/2022 (exact date). ? ? ?The following portions of the patient's history were reviewed and updated as appropriate: allergies, current medications, past family history, past medical history, past social history, past surgical history, and problem list. ? ?Review of Systems ?Pertinent items noted in HPI and remainder of comprehensive ROS otherwise negative.  ? ?Objective:  ?Blood pressure 114/75, resp. rate 16, height 5\' 5"  (1.651 m), weight 212 lb (96.2 kg), last menstrual period 02/01/2022. ?General appearance: alert, cooperative, and no distress ?Abdomen: soft, non-tender; bowel sounds normal; no masses,  no organomegaly ?Pelvic: deferred  ? ?Assessment:  ? ?1. History of miscarriage   ?2. General counseling and advice for contraceptive management   ?3. Oral contraception initial prescription   ?  ? ?Plan:  ? ? ?Reviewed all forms of birth control options available including abstinence; fertility period awareness methods; over the counter/barrier methods; hormonal contraceptive medication including pill, patch, ring, injection,contraceptive implant; hormonal and nonhormonal IUDs; permanent sterilization options including vasectomy and the various tubal sterilization modalities were not discussed. Risks and benefits reviewed.  Questions were answered.  Will try Nextellis.  Given sample and will prescribe.  ?Return to clinic for any scheduled appointments or for any gynecologic concerns as needed.  ? ? ? ?Rubie Maid, MD ?[facility]  ?

## 2022-02-05 ENCOUNTER — Encounter: Payer: Self-pay | Admitting: Obstetrics and Gynecology

## 2022-02-05 ENCOUNTER — Ambulatory Visit (INDEPENDENT_AMBULATORY_CARE_PROVIDER_SITE_OTHER): Payer: Medicaid Other | Admitting: Obstetrics and Gynecology

## 2022-02-05 VITALS — BP 114/75 | Resp 16 | Ht 65.0 in | Wt 212.0 lb

## 2022-02-05 DIAGNOSIS — Z3009 Encounter for other general counseling and advice on contraception: Secondary | ICD-10-CM

## 2022-02-05 DIAGNOSIS — Z8759 Personal history of other complications of pregnancy, childbirth and the puerperium: Secondary | ICD-10-CM | POA: Diagnosis not present

## 2022-02-05 DIAGNOSIS — Z30011 Encounter for initial prescription of contraceptive pills: Secondary | ICD-10-CM

## 2022-02-05 MED ORDER — NEXTSTELLIS 3-14.2 MG PO TABS: 1.0000 | ORAL_TABLET | Freq: Every day | ORAL | 3 refills | Status: DC

## 2022-09-25 DIAGNOSIS — H5213 Myopia, bilateral: Secondary | ICD-10-CM | POA: Diagnosis not present

## 2022-10-30 IMAGING — CR DG CHEST 2V
2 series · 2 of 2 positions shown · non-contrast
Comparison: None.

CLINICAL DATA: Shortness of breath.

EXAM:
CHEST - 2 VIEW

[chest lat]
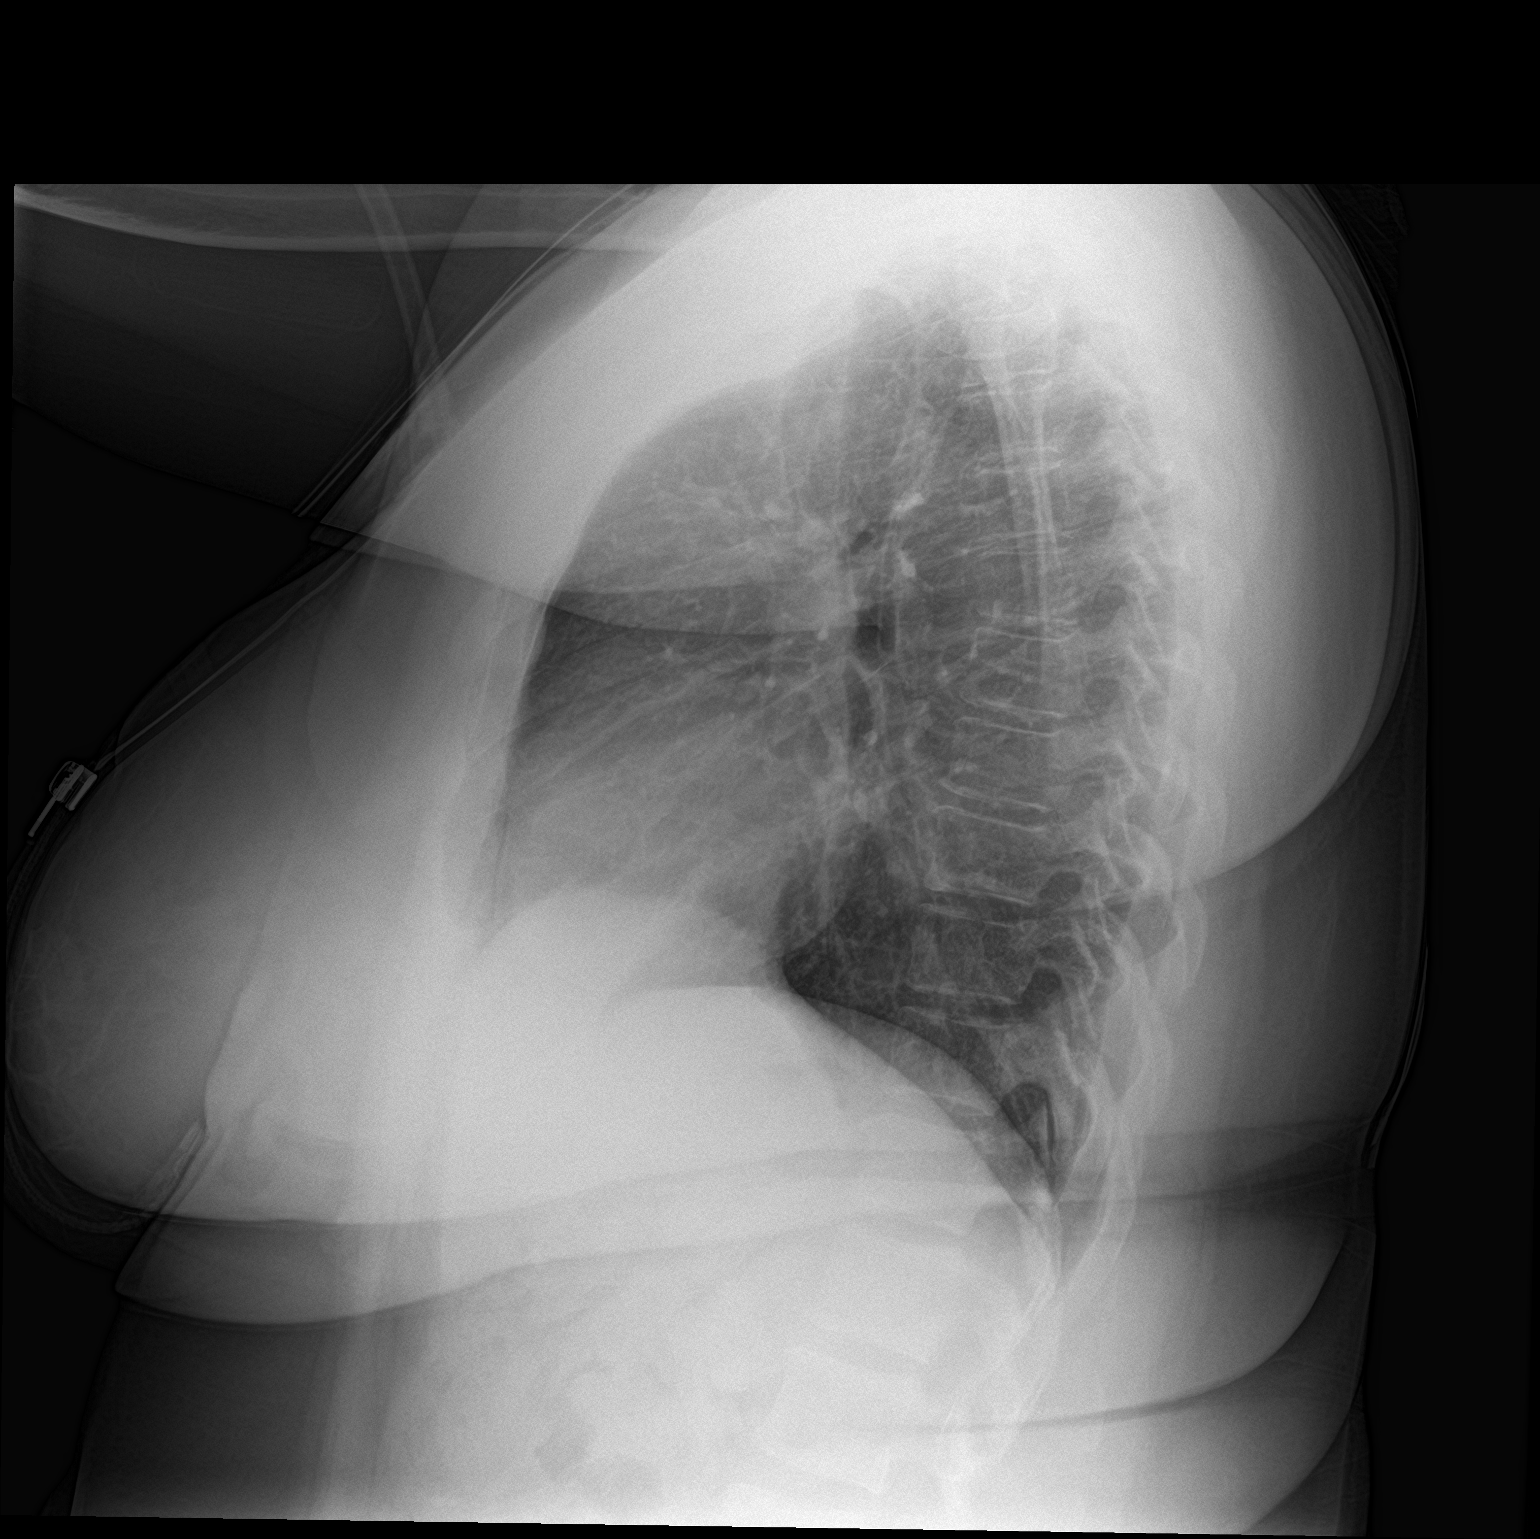

[chest pa]
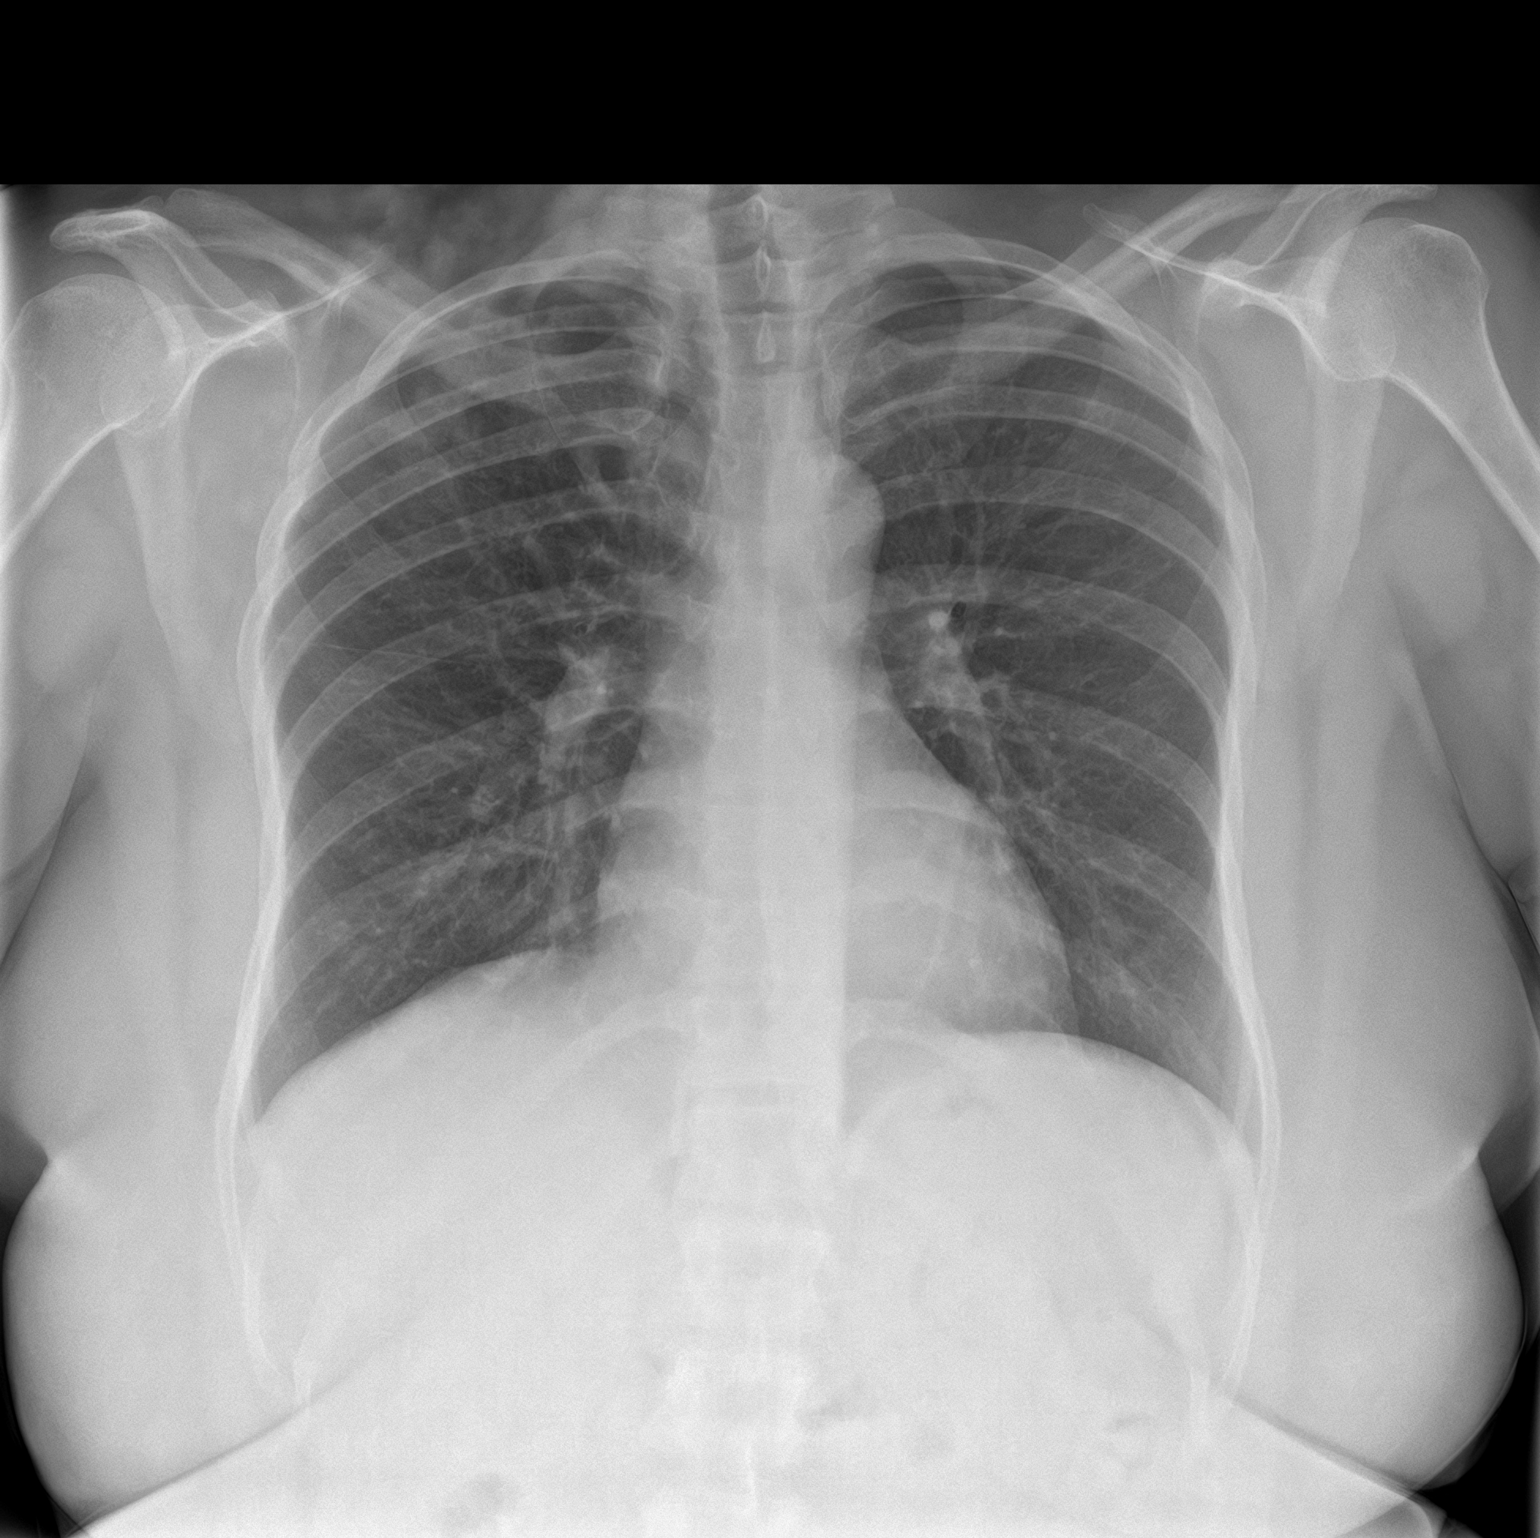

[2 of 2 positions shown; findings below may reference images not displayed]

FINDINGS: Mild artifact in the right upper lung zone from overlying hair.The
cardiomediastinal contours are normal. The lungs are clear.
Pulmonary vasculature is normal. No consolidation, pleural effusion,
or pneumothorax. No acute osseous abnormalities are seen.
IMPRESSION: No acute chest findings.

## 2023-03-03 IMAGING — US US OB < 14 WEEKS - US OB TV
1 series · 14 of 28 positions shown · non-contrast
Comparison: None.

CLINICAL DATA: Vaginal bleeding.

EXAM:
OBSTETRIC <14 WK US AND TRANSVAGINAL OB US
TECHNIQUE: Both transabdominal and transvaginal ultrasound examinations were
performed for complete evaluation of the gestation as well as the
maternal uterus, adnexal regions, and pelvic cul-de-sac.
Transvaginal technique was performed to assess early pregnancy.

[Series 1: us ob less than 14 weeks with ob transvaginal · 14 of 107 slices shown]
[im 4/107]
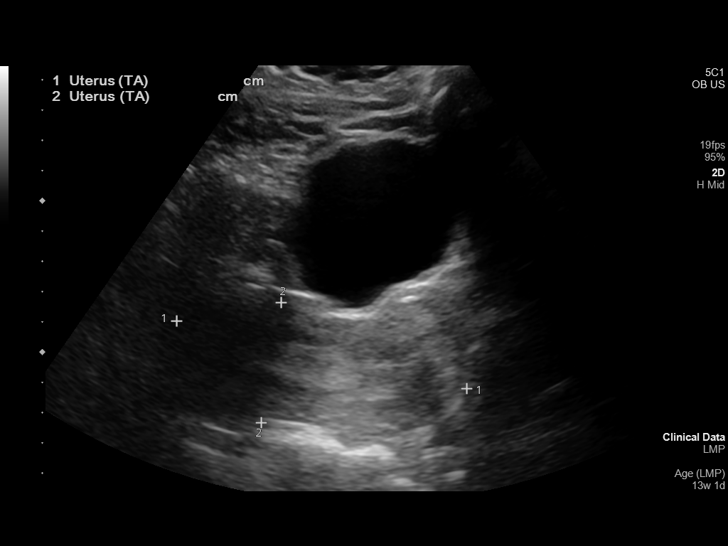
[im 12/107]
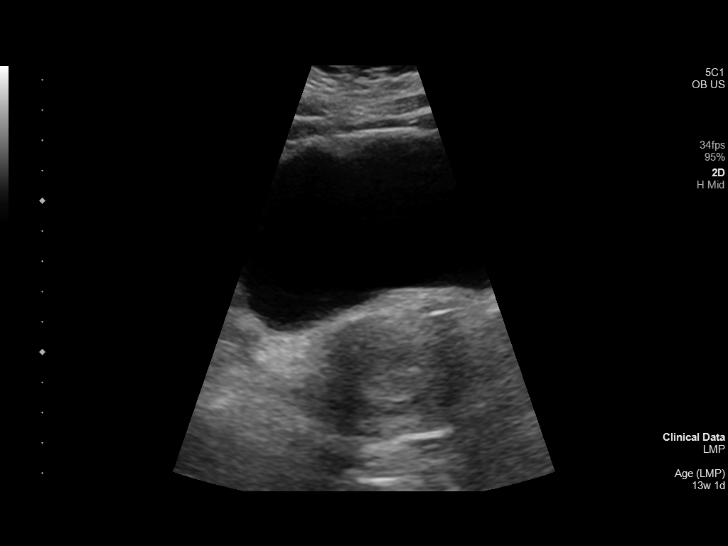
[im 20/107]
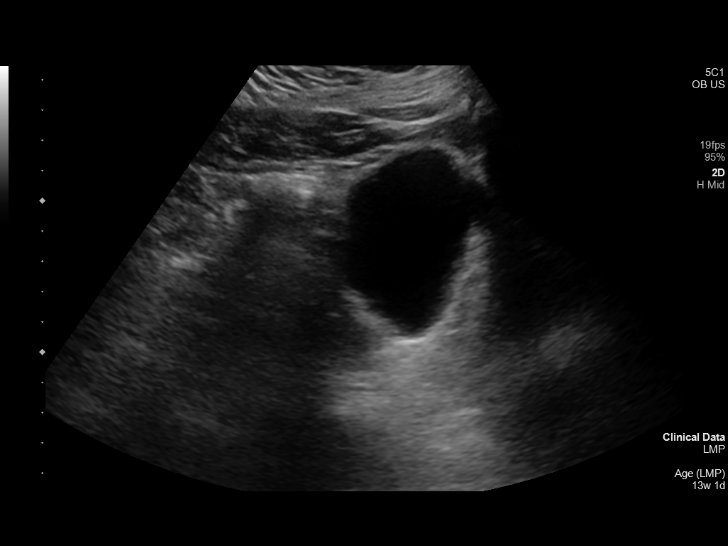
[im 28/107]
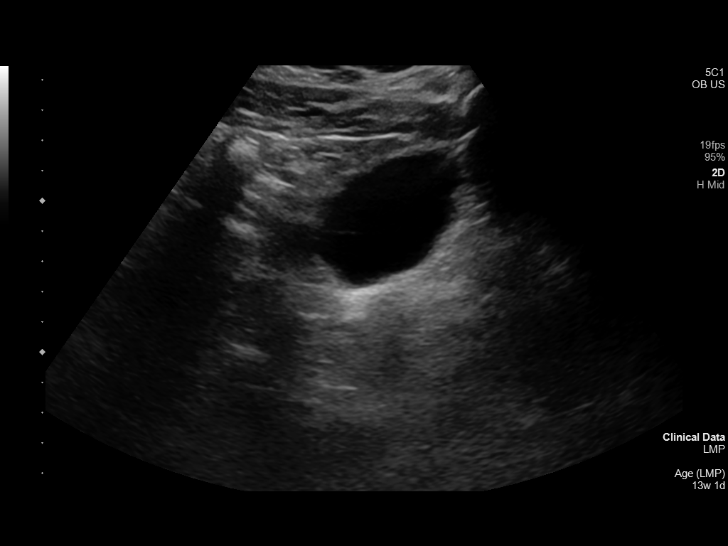
[im 36/107]
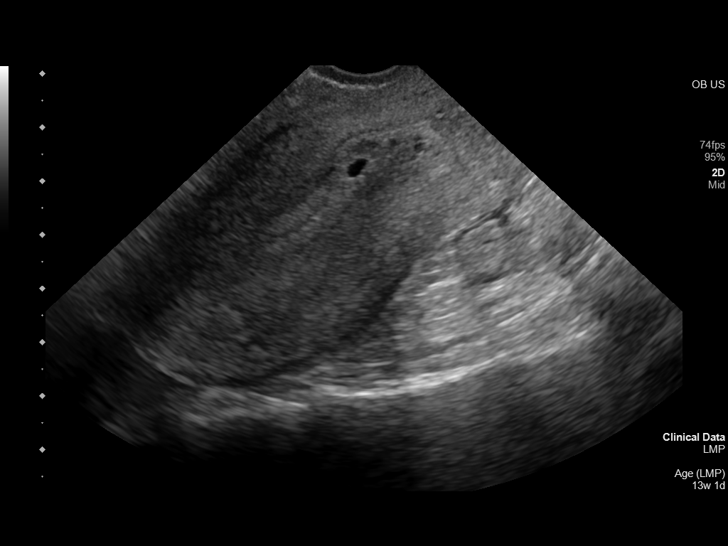
[im 44/107]
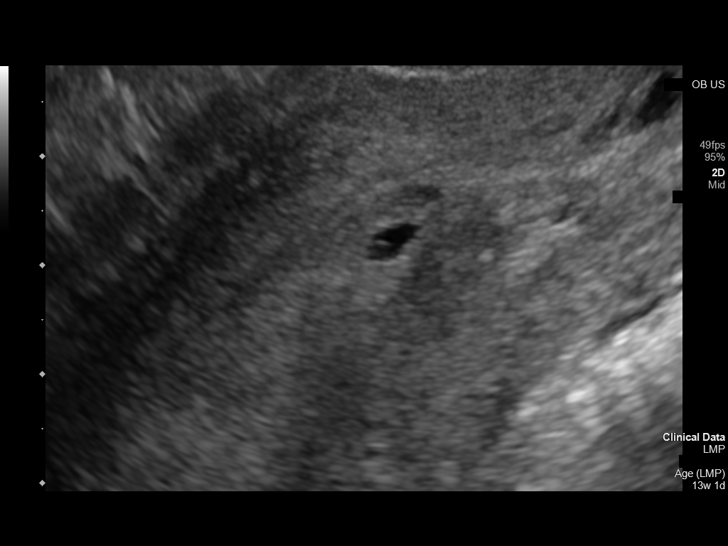
[im 52/107]
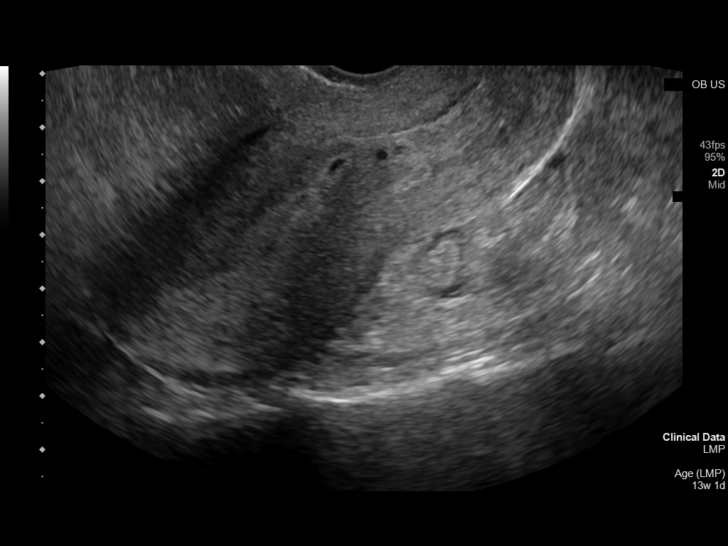
[im 59/107]
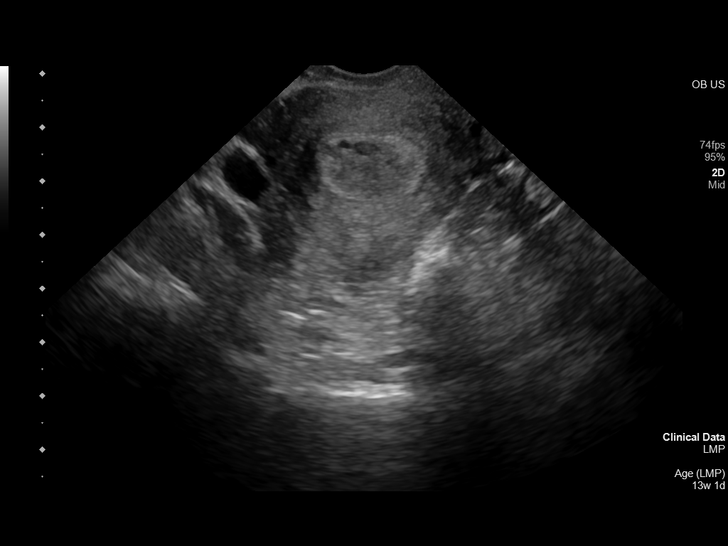
[im 67/107]
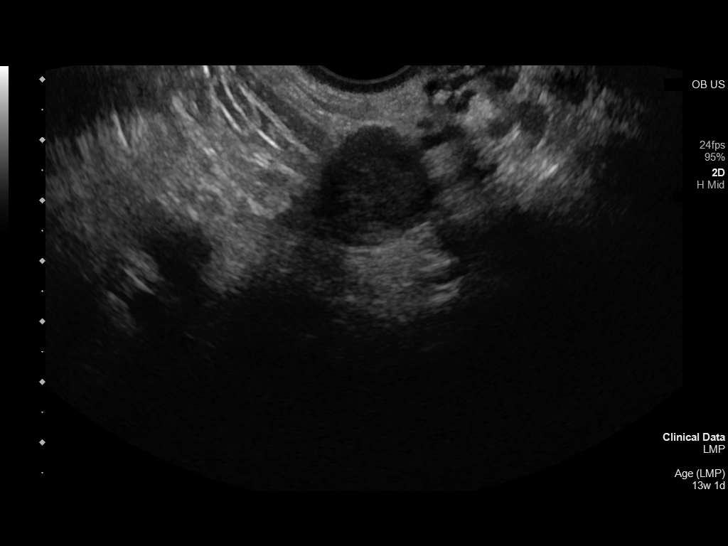
[im 75/107]
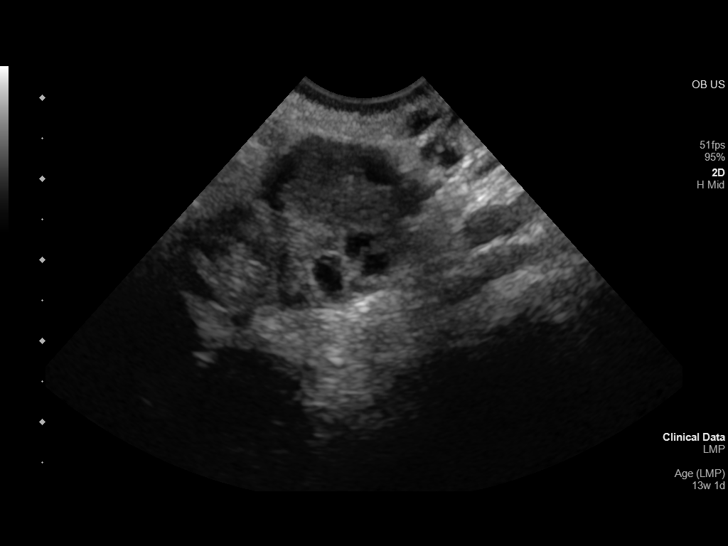
[im 83/107]
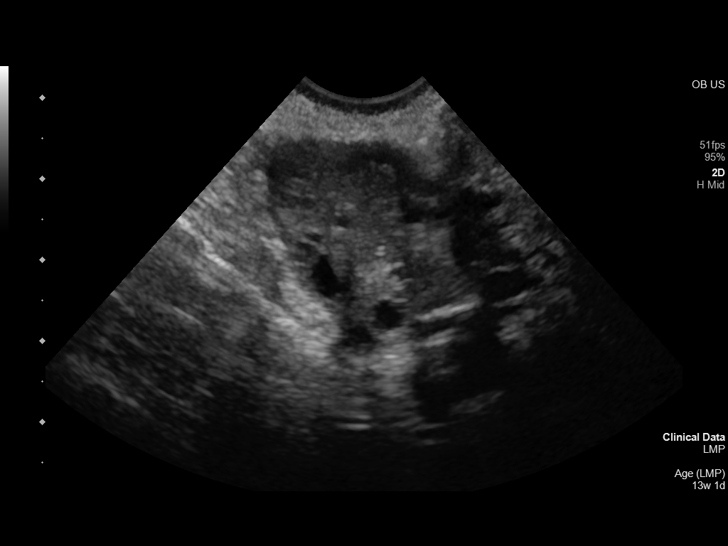
[im 91/107]
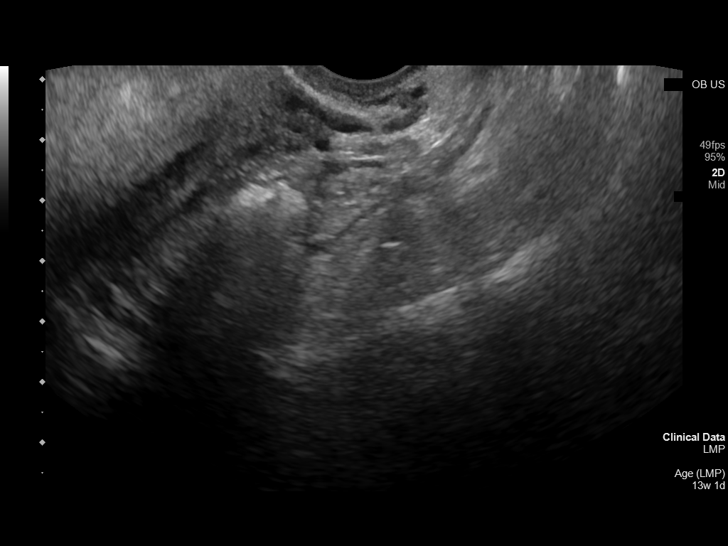
[im 99/107]
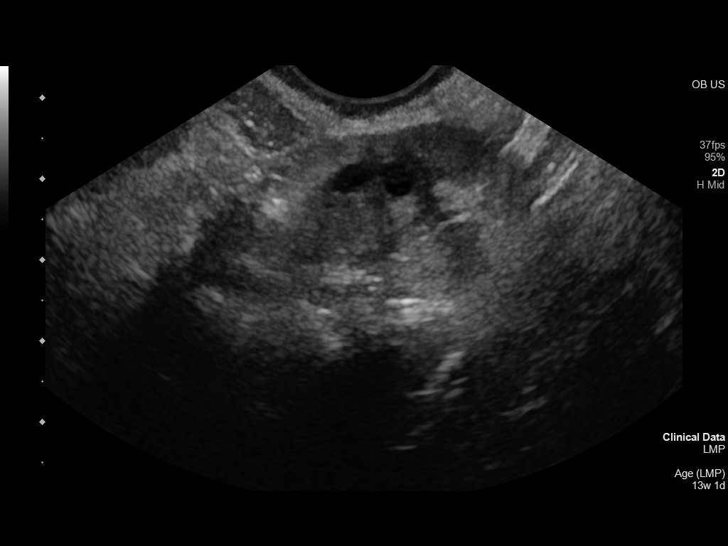
[im 107/107]
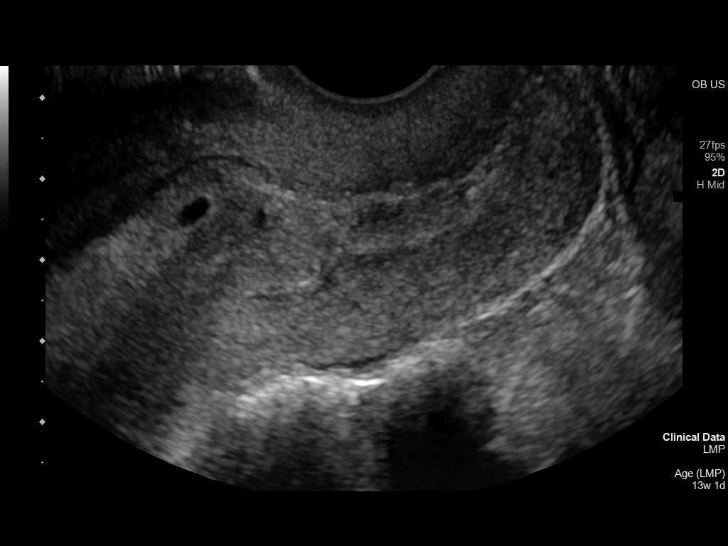

[14 of 28 positions shown; findings below may reference images not displayed]

FINDINGS: Intrauterine gestational sac: Single

Yolk sac:  Visualized.

Embryo:  Not Visualized.

Cardiac Activity: Not Visualized.

MSD: 3.8 mm   5 w   1 d

Subchorionic hemorrhage:  None visualized.

Maternal uterus/adnexae: Ovaries are unremarkable. No free fluid is
noted.
IMPRESSION: Probable early intrauterine gestational sac with yolk sac, but no
fetal pole or cardiac activity yet visualized. Recommend follow-up
quantitative B-HCG levels and follow-up US in 14 days to assess
viability. This recommendation follows SRU consensus guidelines:
Diagnostic Criteria for Nonviable Pregnancy Early in the First
Trimester. N Engl J Med 6673; [DATE].

## 2023-07-11 ENCOUNTER — Ambulatory Visit
Admission: RE | Admit: 2023-07-11 | Discharge: 2023-07-11 | Disposition: A | Discharge status: Home / Self Care | Payer: 59 | Source: Ambulatory Visit | Attending: Family Medicine | Admitting: Family Medicine

## 2023-07-11 ENCOUNTER — Other Ambulatory Visit: Payer: Self-pay

## 2023-07-11 VITALS — BP 127/86 | HR 94 | Temp 98.7°F | Resp 18

## 2023-07-11 DIAGNOSIS — Z20822 Contact with and (suspected) exposure to covid-19: Secondary | ICD-10-CM

## 2023-07-11 DIAGNOSIS — J069 Acute upper respiratory infection, unspecified: Secondary | ICD-10-CM | POA: Diagnosis not present

## 2023-07-11 DIAGNOSIS — R0981 Nasal congestion: Secondary | ICD-10-CM

## 2023-07-11 DIAGNOSIS — J3489 Other specified disorders of nose and nasal sinuses: Secondary | ICD-10-CM

## 2023-07-11 MED ORDER — PREDNISONE 20 MG PO TABS: 20.0000 mg | ORAL_TABLET | Freq: Every day | ORAL | 0 refills | Status: AC

## 2023-07-11 MED ORDER — PROMETHAZINE-DM 6.25-15 MG/5ML PO SYRP: 5.0000 mL | ORAL_SOLUTION | Freq: Four times a day (QID) | ORAL | 0 refills | Status: DC | PRN

## 2023-07-11 NOTE — ED Provider Notes (Signed)
Renaldo Fiddler    CSN: [id number] Arrival date & time: 07/11/23  7829      History   Chief Complaint Chief Complaint  Patient presents with   Headache    Nasal congestion, body aches - Entered by patient   URI    HPI Stacy Weaver is a 42 y.o. female.  Patient here with ear pain patient presents today with a 4-day history of bodyaches, headache, nasal congestion.  Endorses headache pain is her worst on the right side of her head compared to left.  She has not had fever. Patient end   Past Medical History:  Diagnosis Date   Sickle cell trait [facility])     Patient Active Problem List   Diagnosis Date Noted   Obesity (BMI 35.0-39.9 without comorbidity) 12/26/2021    Past Surgical History:  Procedure Laterality Date   TOOTH EXTRACTION      OB History     Gravida  4   Para  3   Term  3   Preterm  0   AB  0   Living  1      SAB  0   IAB  0   Ectopic  0   Multiple      Live Births  1            Home Medications    Prior to Admission medications   Medication Sig Start Date End Date Taking? Authorizing Provider  Drospirenone-Estetrol (NEXTSTELLIS) 3-14.2 MG TABS Take 1 tablet by mouth daily. 02/05/22   Hildred Laser, MD    Family History Family History  Problem Relation Age of Onset   Hyperlipidemia Father    Arthritis Maternal Grandmother    Kidney failure Paternal Grandmother    Colon cancer Neg Hx    Breast cancer Neg Hx    Cervical cancer Neg Hx     Social History Social History   Tobacco Use   Smoking status: Never  Substance Use Topics   Alcohol use: Yes    Comment: socially   Drug use: No     Allergies   Latex and Nsaids   Review of Systems Review of Systems  Neurological:  Positive for headaches.     Physical Exam Triage Vital Signs ED Triage Vitals  Encounter Vitals Group     BP 07/11/23 0955 127/86     Systolic BP Percentile --      Diastolic BP Percentile --      Pulse Rate 07/11/23 0955 94      Resp 07/11/23 0955 18     Temp 07/11/23 0955 98.7 F (37.1 C)     Temp Source 07/11/23 0955 Oral     SpO2 07/11/23 0955 96 %     Weight --      Height --      Head Circumference --      Peak Flow --      Pain Score 07/11/23 0954 8     Pain Loc --      Pain Education --      Exclude from Growth Chart --    No data found.  Updated Vital Signs BP 127/86 (BP Location: Left Arm)   Pulse 94   Temp 98.7 F (37.1 C) (Oral)   Resp 18   LMP 07/04/2023   SpO2 96%   Visual Acuity Right Eye Distance:   Left Eye Distance:   Bilateral Distance:    Right Eye Near:   Left Eye Near:  Bilateral Near:     Physical Exam   UC Treatments / Results  Labs (all labs ordered are listed, but only abnormal results are displayed) Labs Reviewed - No data to display  EKG   Radiology No results found.  Procedures Procedures (including critical care time)  Medications Ordered in UC Medications - No data to display  Initial Impression / Assessment and Plan / UC Course  I have reviewed the triage vital signs and the nursing notes.  Pertinent labs & imaging results that were available during my care of the patient were reviewed by me and considered in my medical decision making (see chart for details).     *** Final Clinical Impressions(s) / UC Diagnoses   Final diagnoses:  None   Discharge Instructions   None    ED Prescriptions   None    PDMP not reviewed this encounter.

## 2023-07-11 NOTE — Discharge Instructions (Signed)
Your COVID 19 results will be available in 24-48 hours. Negative results are immediately resulted to Mychart. Positive results will receive a follow-up call from our clinic.  Symptoms appear to be viral continue with treatment as prescribed.  Take medication as prescribed.  If symptoms worsen or do not improve within 5 to 7 days return for evaluation.

## 2023-07-11 NOTE — ED Triage Notes (Signed)
Patient presents to [facility] for evaluation of runny nose, headache, sore throat, nasal congestion.  Denies fever.  Patient states the headache is worse with position changes and on the right side.

## 2023-07-12 LAB — SARS CORONAVIRUS 2 (TAT 6-24 HRS): SARS Coronavirus 2: POSITIVE — AB

## 2024-03-14 ENCOUNTER — Emergency Department (HOSPITAL_COMMUNITY)
Admission: EM | Admit: 2024-03-14 | Discharge: 2024-03-15 | Disposition: A | Discharge status: Home / Self Care | Payer: PRIVATE HEALTH INSURANCE | Attending: Emergency Medicine | Admitting: Emergency Medicine

## 2024-03-14 DIAGNOSIS — Z91040 Latex allergy status: Secondary | ICD-10-CM | POA: Insufficient documentation

## 2024-03-14 DIAGNOSIS — R519 Headache, unspecified: Secondary | ICD-10-CM | POA: Diagnosis present

## 2024-03-14 DIAGNOSIS — G43809 Other migraine, not intractable, without status migrainosus: Secondary | ICD-10-CM | POA: Insufficient documentation

## 2024-03-14 NOTE — ED Triage Notes (Signed)
 Pt c/o migraine x 2 days, has taken tylenol  without relief.

## 2024-03-15 ENCOUNTER — Encounter (HOSPITAL_COMMUNITY): Payer: Self-pay | Admitting: Emergency Medicine

## 2024-03-15 ENCOUNTER — Other Ambulatory Visit: Payer: Self-pay

## 2024-03-15 ENCOUNTER — Emergency Department (HOSPITAL_COMMUNITY): Payer: PRIVATE HEALTH INSURANCE

## 2024-03-15 DIAGNOSIS — G43809 Other migraine, not intractable, without status migrainosus: Secondary | ICD-10-CM | POA: Diagnosis not present

## 2024-03-15 LAB — HCG, QUANTITATIVE, PREGNANCY: hCG, Beta Chain, Quant, S: <1 m[IU]/mL (ref <5)

## 2024-03-15 MED ORDER — DEXAMETHASONE SODIUM PHOSPHATE 10 MG/ML IJ SOLN
10.0000 mg | Freq: Once | INTRAMUSCULAR | Status: AC
  Administered 2024-03-15: 10 mg via INTRAVENOUS
  Filled 2024-03-15: qty 1

## 2024-03-15 MED ORDER — PROCHLORPERAZINE EDISYLATE 10 MG/2ML IJ SOLN
10.0000 mg | Freq: Once | INTRAMUSCULAR | Status: AC
  Administered 2024-03-15: 10 mg via INTRAVENOUS
  Filled 2024-03-15: qty 2

## 2024-03-15 MED ORDER — DIPHENHYDRAMINE HCL 50 MG/ML IJ SOLN
25.0000 mg | Freq: Once | INTRAMUSCULAR | Status: AC
  Administered 2024-03-15: 25 mg via INTRAVENOUS
  Filled 2024-03-15: qty 1

## 2024-03-15 NOTE — ED Provider Notes (Signed)
 9:01 AM Patient signed out to me by previous ED physician. Pt is a 43 yo female with pmh of migraines presenting for headache x2 days that is more severe than her baseline.   CTH pending.  Cannot have NSAIDS. Benadryl , Compazine , and Decadron .   Physical Exam  BP 127/71 (BP Location: Right Arm)   Pulse 79   Temp (!) 97.5 F (36.4 C) (Oral)   Resp 18   Ht 5\' 5"  (1.651 m)   Wt 99.8 kg   LMP 03/01/2024   SpO2 100%   BMI 36.61 kg/m   Physical Exam  Procedures  Procedures  ED Course / MDM    Medical Decision Making Amount and/or Complexity of Data Reviewed Labs: ordered. Radiology: ordered.  Risk Prescription drug management.   On reevaluation of patient, she has complete resolution of headache and states her pain is 0 out of 10 currently.  Her CT head demonstrates no acute process.  She remains neurovascularly intact and is safe to be discharged at this time.  She does not currently have a PCP.  I have given her the contact information and phone number for her wellness center.  I recommend she establishes care with them and/or a primary care physician for discussion of migraine management and abortive medications since she gets them frequently.  I also recommend close follow-up for consideration for MRI brain if symptoms continue and become more frequent.  Patient in no distress and overall condition improved here in the ED. Detailed discussions were had with the patient regarding current findings, and need for close f/u with PCP or on call doctor. The patient has been instructed to return immediately if the symptoms worsen in any way for re-evaluation. Patient verbalized understanding and is in agreement with current care plan. All questions answered prior to discharge.       Owen Blowers P, DO 03/15/24 [phone]

## 2024-03-29 NOTE — ED Provider Notes (Signed)
 [place] EMERGENCY DEPARTMENT AT [facility] Provider Note   CSN: [id number] Arrival date & time: 03/14/24  2356     History  Chief Complaint  Patient presents with   Migraine    Stacy Weaver is a 43 y.o. female.  Two days of headache. Similar to previous but lasting longer. No neuro symptoms. No fever, neck pain, rigidity. No trauma. No recent illnesses.    Migraine       Home Medications Prior to Admission medications   Medication Sig Start Date End Date Taking? Authorizing Provider  acetaminophen  (TYLENOL ) 500 MG tablet Take 1,000 mg by mouth every 6 (six) hours as needed for mild pain (pain score 1-3).   Yes [provider]      Allergies    Latex and Nsaids    Review of Systems   Review of Systems  Physical Exam Updated Vital Signs BP 105/68   Pulse 82   Temp 97.7 F (36.5 C) (Oral)   Resp 18   Ht 5\' 5"  (1.651 m)   Wt 99.8 kg   LMP 03/01/2024   SpO2 100%   BMI 36.61 kg/m  Physical Exam Vitals and nursing note reviewed.  Constitutional:      Appearance: She is well-developed.  HENT:     Head: Normocephalic and atraumatic.  Cardiovascular:     Rate and Rhythm: Normal rate and regular rhythm.  Pulmonary:     Effort: No respiratory distress.     Breath sounds: No stridor.  Abdominal:     General: There is no distension.  Musculoskeletal:     Cervical back: Normal range of motion.  Skin:    General: Skin is warm and dry.  Neurological:     General: No focal deficit present.     Mental Status: She is alert.     Comments: No altered mental status, able to give full seemingly accurate history.  Face is symmetric, EOM's intact, pupils equal and reactive, vision intact, tongue and uvula midline without deviation. Upper and Lower extremity motor 5/5, intact pain perception in distal extremities, 2+ reflexes in biceps, patella and achilles tendons. Able to perform finger to nose normal with both hands. Walks without assistance  or evident ataxia.       ED Results / Procedures / Treatments   Labs (all labs ordered are listed, but only abnormal results are displayed) Labs Reviewed  HCG, QUANTITATIVE, PREGNANCY    EKG None  Radiology No results found.  Procedures Procedures    Medications Ordered in ED Medications  prochlorperazine  (COMPAZINE ) injection 10 mg (10 mg Intravenous Given 03/15/24 0615)  diphenhydrAMINE  (BENADRYL ) injection 25 mg (25 mg Intravenous Given 03/15/24 0615)  dexamethasone  (DECADRON ) injection 10 mg (10 mg Intravenous Given 03/15/24 4098)    ED Course/ Medical Decision Making/ A&P                                 Medical Decision Making Amount and/or Complexity of Data Reviewed Labs: ordered. Radiology: ordered.  Risk Prescription drug management.   The patient arrived with signs and symptoms consistent with a migraine headache. The patient has history of migraines. This feels like previous migraines but more severe and lasting longer. No recent imaging. Will treat as migraine but get ct to ensure no changes.   Care transferred pending CT and reevaluation after medication administration.    Final Clinical Impression(s) / ED Diagnoses Final diagnoses:  Other migraine without status migrainosus, not intractable    Rx / DC Orders ED Discharge Orders     None         Catlin Doria, Reymundo Caulk, MD 03/29/24 [phone]

## 2024-08-17 ENCOUNTER — Other Ambulatory Visit: Payer: Self-pay | Admitting: Obstetrics and Gynecology

## 2024-08-17 DIAGNOSIS — O09529 Supervision of elderly multigravida, unspecified trimester: Secondary | ICD-10-CM

## 2024-09-22 DIAGNOSIS — D582 Other hemoglobinopathies: Secondary | ICD-10-CM | POA: Insufficient documentation

## 2024-09-22 DIAGNOSIS — Z8489 Family history of other specified conditions: Secondary | ICD-10-CM | POA: Insufficient documentation

## 2024-09-22 DIAGNOSIS — O09529 Supervision of elderly multigravida, unspecified trimester: Secondary | ICD-10-CM | POA: Insufficient documentation

## 2024-10-05 ENCOUNTER — Other Ambulatory Visit: Payer: PRIVATE HEALTH INSURANCE

## 2024-10-05 ENCOUNTER — Ambulatory Visit: Payer: PRIVATE HEALTH INSURANCE

## 2024-10-05 ENCOUNTER — Ambulatory Visit: Payer: PRIVATE HEALTH INSURANCE | Attending: Obstetrics and Gynecology | Admitting: Obstetrics and Gynecology

## 2024-10-05 VITALS — BP 109/74

## 2024-10-05 DIAGNOSIS — Z3A24 24 weeks gestation of pregnancy: Secondary | ICD-10-CM

## 2024-10-05 DIAGNOSIS — O09521 Supervision of elderly multigravida, first trimester: Secondary | ICD-10-CM | POA: Diagnosis not present

## 2024-10-05 DIAGNOSIS — O09529 Supervision of elderly multigravida, unspecified trimester: Secondary | ICD-10-CM

## 2024-10-05 DIAGNOSIS — E669 Obesity, unspecified: Secondary | ICD-10-CM

## 2024-10-05 DIAGNOSIS — O99212 Obesity complicating pregnancy, second trimester: Secondary | ICD-10-CM

## 2024-10-05 DIAGNOSIS — D582 Other hemoglobinopathies: Secondary | ICD-10-CM

## 2024-10-05 DIAGNOSIS — O09522 Supervision of elderly multigravida, second trimester: Secondary | ICD-10-CM

## 2024-10-05 DIAGNOSIS — Z8489 Family history of other specified conditions: Secondary | ICD-10-CM

## 2024-10-05 NOTE — Progress Notes (Signed)
   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 [id number] at [redacted]w[redacted]d here for ultrasound and consultation. Stacy Weaver is doing well today with no acute concerns. Today we focused on the following:   Pregnancy is complicated by advanced maternal age of 24 and elevated BMI, both of which increase the risk of hypertensive disorders, gestational diabetes, fetal growth abnormalities, stillbirth, and cesarean delivery. The importance of appropriate weight gain, diet, and exercise during pregnancy was reviewed. Increased risks related to placental function and fetal well-being were discussed, along with the need for serial growth assessment and antenatal surveillance later in pregnancy. Patient reports no current concerns and endorses good fetal movement.  I discussed the initial measurements by the sonographer but the fetal weight in the 15 percentile overall.  I performed additional measurements and the fetal weight is now at the 22nd percentile overall.  Due to the lower end of normal growth we will bring the patient back in 3 to 4 weeks to assess fetal growth.  Recommendations: -Due to low normal growth a F/u growth should be done in 3-4 weeks -Early glucose screening with repeat testing at 24-28 weeks -Baseline preeclampsia labs -Aspirin 81-162 mg daily as indicated -Nutrition and exercise counseling with recommended weight gain guidance -Serial fetal growth ultrasounds in the third trimester -Antenatal fetal surveillance beginning at 32-34 weeks -Strict  fetal movement monitoring  I spent 60 minutes reviewing the patients chart, including labs and images as well as counseling the patient about her medical conditions. Greater than 50% of the time was spent in direct face-to-face patient counseling.  [name]  MFM, [place]   10/05/2024  5:33 PM   Review of Systems: A review of systems was performed and was negative except per HPI   Vitals and Physical Exam    10/05/2024   12:56 PM 03/15/2024    9:00 AM 03/15/2024    7:37 AM  Vitals with BMI  Systolic 109 105 872  Diastolic 74 68 71  Pulse  82 79    Sitting comfortably on the sonogram table Nonlabored breathing Normal rate and rhythm Abdomen is nontender  Past pregnancies OB History  Gravida Para Term Preterm AB Living  5 3 3  0 1 3  SAB IAB Ectopic Multiple Live Births  1 0 0  3    # Outcome Date GA Lbr Len/2nd Weight Sex Type Anes PTL Lv  5 Current           4 Term 06/24/12 [redacted]w[redacted]d 21:32 / 00:10 7 lb 0.7 oz (3.195 kg) M Vag-Spont EPI  LIV  3 SAB           2 Term           1 Term              Future Appointments  Date Time Provider Department Center  11/04/2024  7:15 AM [facility] PROVIDER 1 [facility]  11/04/2024  7:30 AM [facility] US3 [facility]

## 2024-11-04 ENCOUNTER — Ambulatory Visit: Payer: PRIVATE HEALTH INSURANCE

## 2024-11-05 ENCOUNTER — Ambulatory Visit: Payer: PRIVATE HEALTH INSURANCE

## 2024-11-05 ENCOUNTER — Ambulatory Visit: Payer: PRIVATE HEALTH INSURANCE | Attending: Obstetrics and Gynecology | Admitting: Maternal & Fetal Medicine

## 2024-11-05 VITALS — BP 129/62 | HR 90

## 2024-11-05 DIAGNOSIS — O99213 Obesity complicating pregnancy, third trimester: Secondary | ICD-10-CM | POA: Diagnosis not present

## 2024-11-05 DIAGNOSIS — Z362 Encounter for other antenatal screening follow-up: Secondary | ICD-10-CM | POA: Insufficient documentation

## 2024-11-05 DIAGNOSIS — D582 Other hemoglobinopathies: Secondary | ICD-10-CM

## 2024-11-05 DIAGNOSIS — E669 Obesity, unspecified: Secondary | ICD-10-CM

## 2024-11-05 DIAGNOSIS — O09523 Supervision of elderly multigravida, third trimester: Secondary | ICD-10-CM

## 2024-11-05 DIAGNOSIS — Z3A29 29 weeks gestation of pregnancy: Secondary | ICD-10-CM

## 2024-11-05 DIAGNOSIS — Z3A24 24 weeks gestation of pregnancy: Secondary | ICD-10-CM

## 2024-11-05 NOTE — Progress Notes (Signed)
 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 [id number] at [redacted]w[redacted]d here for ultrasound and consultation. Stacy Weaver is doing well today with no acute concerns. Today we focused on the following:   The patient presents today for a growth ultrasound due to advanced maternal age. She is currently 44 years old. Growth ultrasound today demonstrates appropriate fetal growth, with estimated fetal biometry at approximately the 25th percentile.  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 She has no history of preeclampsia or other hypertensive disorders in prior pregnancies. We discussed that given her allergic reaction and her reassuring obstetric history, the potential risks of aspirin use outweigh the potential benefit in her case.  She was advised to continue routine prenatal care with her primary obstetric provider. Ongoing fetal surveillance is recommended due to advanced maternal age, with serial growth ultrasounds and antenatal testing as outlined below. She should be referred back to maternal-fetal medicine if additional complications arise.  Recommendations -Continue routine prenatal care with primary obstetric provider. -Do not initiate aspirin therapy given history of allergic reaction and low preeclampsia risk based on prior obstetric history. -Perform serial growth ultrasounds every 4-5 weeks due to advanced maternal age - to be done at  [facility]. -Initiate antenatal testing between 32-[redacted] weeks gestation - to be done at [facility]. Use either weekly biophysical profiles or twice-weekly nonstress tests with weekly amniotic fluid assessment. -Refer back to maternal-fetal medicine if additional maternal or fetal complications develop. -Standard OB precautions given.   The patient had time to ask questions that were answered to her satisfaction.  She verbalized understanding and agrees to proceed with the plan above.   I spent 40 minutes reviewing the patients chart, including labs and images as well as counseling the patient about her medical conditions. Greater than 50% of the time was spent in direct face-to-face patient counseling.  [name]  MFM, [place]   11/05/2024  8:19 AM   Review of Systems: A review of systems was performed and was negative except per HPI   Vitals and Physical Exam    11/05/2024    8:08 AM 10/05/2024   12:56 PM 03/15/2024    9:00 AM  Vitals with BMI  Systolic 129 109 894  Diastolic 62 74 68  Pulse 90  82    Sitting comfortably on the sonogram table Nonlabored breathing Normal rate and rhythm Abdomen is nontender  Past pregnancies OB History  Gravida Para Term Preterm AB Living  5 3 3  0 1 3  SAB IAB Ectopic Multiple Live Births  1 0 0  3    # Outcome Date GA Lbr Len/2nd Weight Sex Type Anes PTL Lv  5 Current           4 Term 06/24/12 [redacted]w[redacted]d 21:32 / 00:10 7 lb 0.7 oz (3.195 kg) M Vag-Spont EPI  LIV  3 SAB           2 Term  1 Term              Future Appointments  Date Time Provider Department Center  11/05/2024  8:30 AM [facility] US1 [facility]      "
